# Patient Record
Sex: Male | Born: 2000 | Race: Black or African American | Hispanic: No | Marital: Single | Smoking: Never smoker
Health system: Southern US, Community
[De-identification: ages and names within clinical notes are randomized; demographics above are authoritative.]

## PROBLEM LIST (undated history)

## (undated) DIAGNOSIS — J45909 Unspecified asthma, uncomplicated: Secondary | ICD-10-CM

## (undated) HISTORY — DX: Unspecified asthma, uncomplicated: J45.909

---
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
## 2019-03-15 ENCOUNTER — Other Ambulatory Visit: Payer: Self-pay | Admitting: *Deleted

## 2019-03-15 DIAGNOSIS — Z20822 Contact with and (suspected) exposure to covid-19: Secondary | ICD-10-CM

## 2019-03-19 ENCOUNTER — Other Ambulatory Visit: Payer: Self-pay

## 2019-03-19 DIAGNOSIS — Z20822 Contact with and (suspected) exposure to covid-19: Secondary | ICD-10-CM

## 2019-03-24 LAB — NOVEL CORONAVIRUS, NAA: SARS-CoV-2, NAA: NOT DETECTED

## 2019-04-23 ENCOUNTER — Other Ambulatory Visit: Payer: Self-pay

## 2019-04-23 DIAGNOSIS — Z20822 Contact with and (suspected) exposure to covid-19: Secondary | ICD-10-CM

## 2019-04-23 NOTE — Progress Notes (Unsigned)
l °

## 2019-04-24 LAB — NOVEL CORONAVIRUS, NAA: SARS-CoV-2, NAA: NOT DETECTED

## 2019-06-04 ENCOUNTER — Other Ambulatory Visit: Payer: Self-pay

## 2019-06-04 DIAGNOSIS — Z20822 Contact with and (suspected) exposure to covid-19: Secondary | ICD-10-CM

## 2019-06-05 ENCOUNTER — Telehealth: Payer: Medicaid Other | Admitting: Family Medicine

## 2019-06-06 LAB — NOVEL CORONAVIRUS, NAA: SARS-CoV-2, NAA: DETECTED — AB

## 2019-06-12 ENCOUNTER — Other Ambulatory Visit: Payer: Self-pay | Admitting: Family Medicine

## 2019-06-12 DIAGNOSIS — U071 COVID-19: Secondary | ICD-10-CM

## 2019-06-12 DIAGNOSIS — Z8619 Personal history of other infectious and parasitic diseases: Secondary | ICD-10-CM

## 2019-06-12 DIAGNOSIS — Z8616 Personal history of COVID-19: Secondary | ICD-10-CM

## 2019-06-12 DIAGNOSIS — I400 Infective myocarditis: Secondary | ICD-10-CM

## 2019-06-19 ENCOUNTER — Ambulatory Visit (INDEPENDENT_AMBULATORY_CARE_PROVIDER_SITE_OTHER): Payer: Medicaid Other

## 2019-06-19 ENCOUNTER — Other Ambulatory Visit: Payer: Self-pay

## 2019-06-19 ENCOUNTER — Ambulatory Visit
Admission: RE | Admit: 2019-06-19 | Discharge: 2019-06-19 | Disposition: A | Discharge status: Home / Self Care | Payer: Medicaid Other | Source: Ambulatory Visit | Attending: Family Medicine | Admitting: Family Medicine

## 2019-06-19 ENCOUNTER — Other Ambulatory Visit: Payer: Self-pay | Admitting: Family Medicine

## 2019-06-19 DIAGNOSIS — M545 Low back pain, unspecified: Secondary | ICD-10-CM

## 2019-06-19 DIAGNOSIS — Z8616 Personal history of COVID-19: Secondary | ICD-10-CM

## 2019-06-19 DIAGNOSIS — M79605 Pain in left leg: Secondary | ICD-10-CM

## 2019-06-19 DIAGNOSIS — I400 Infective myocarditis: Secondary | ICD-10-CM

## 2019-06-19 DIAGNOSIS — Z8619 Personal history of other infectious and parasitic diseases: Secondary | ICD-10-CM | POA: Diagnosis not present

## 2019-06-19 DIAGNOSIS — U071 COVID-19: Secondary | ICD-10-CM | POA: Diagnosis not present

## 2019-06-19 IMAGING — CR DG LUMBAR SPINE COMPLETE 4+V
1 series · 6 of 6 positions shown · non-contrast
Comparison: None.

CLINICAL DATA: Low back pain for few weeks.  Lifting injury.

EXAM:
LUMBAR SPINE - COMPLETE 4+ VIEW

[Series 1: dg lumbar spine complete 4 +v · 0.14mm/px · 6 of 6 slices shown]
[im 1/6]
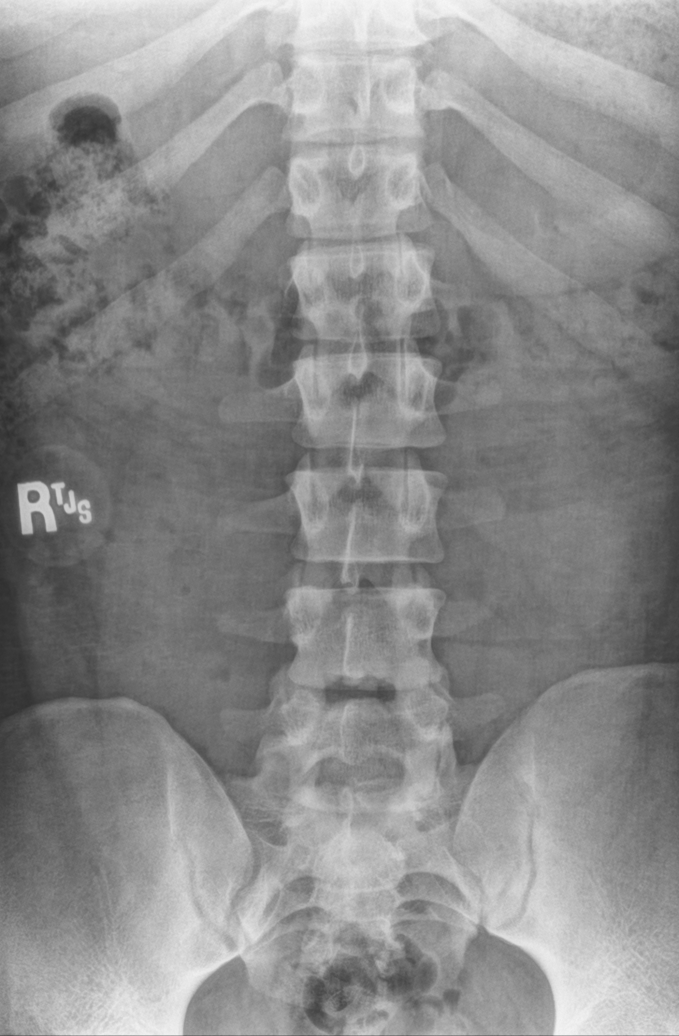
[im 2/6]
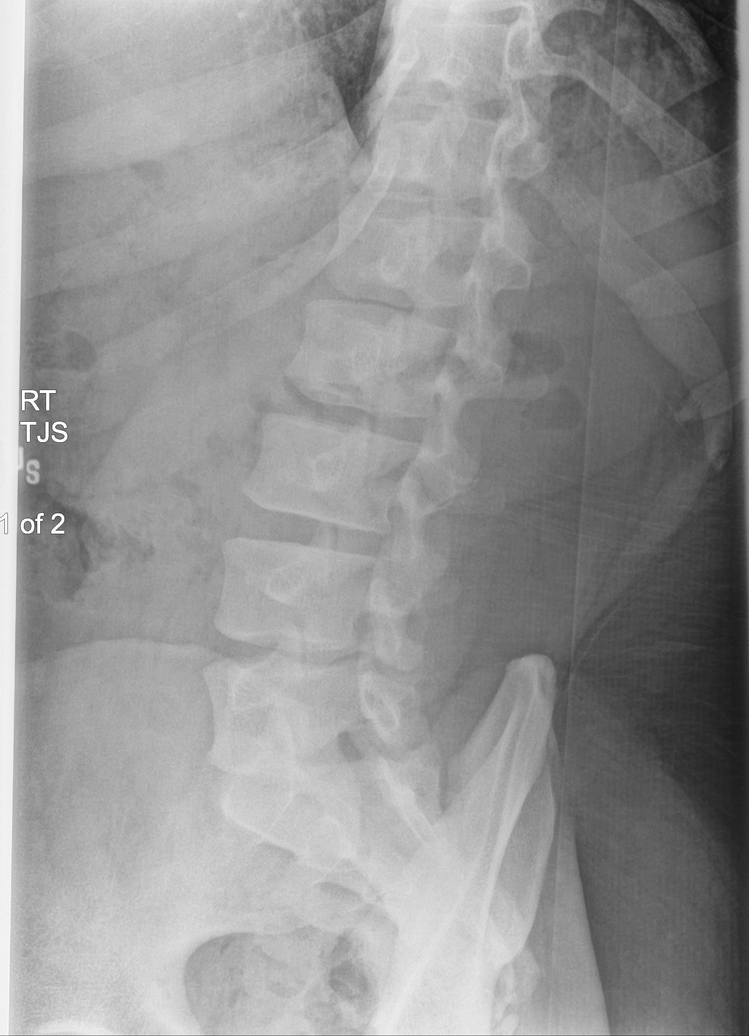
[im 3/6]
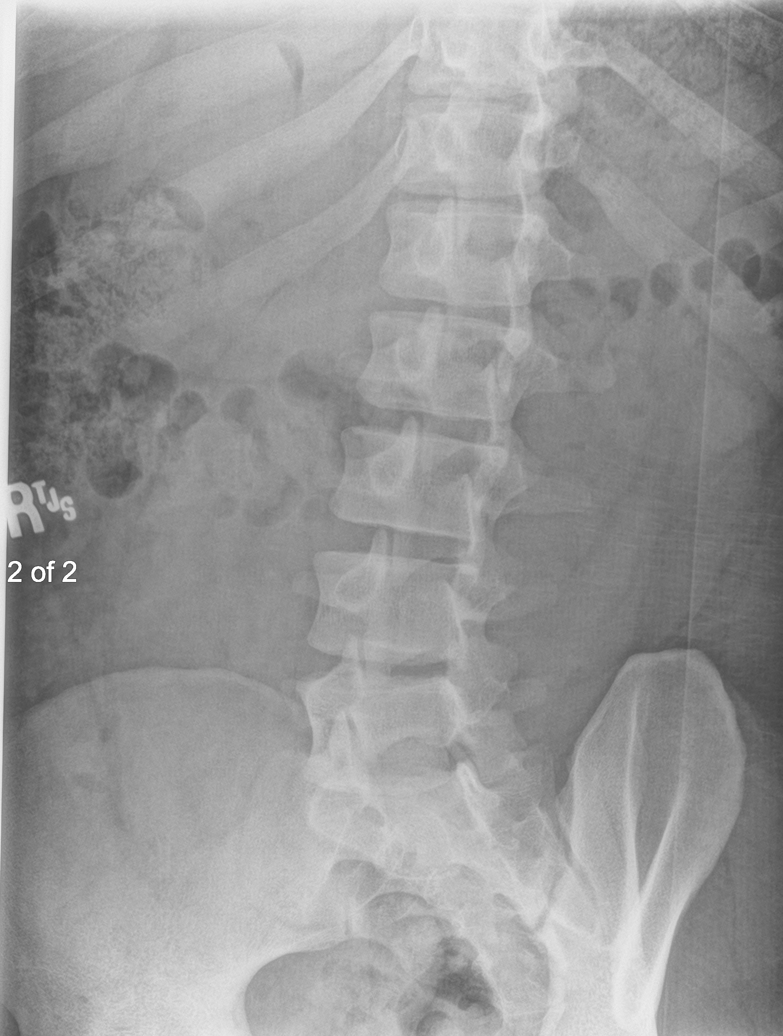
[im 4/6]
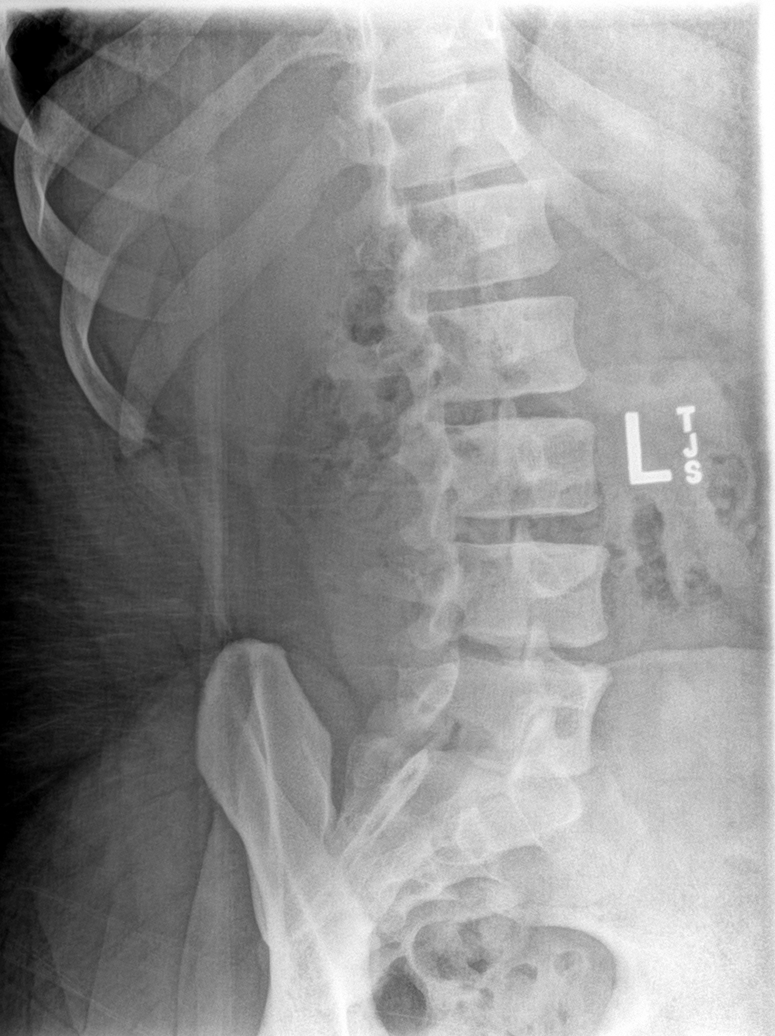
[im 5/6]
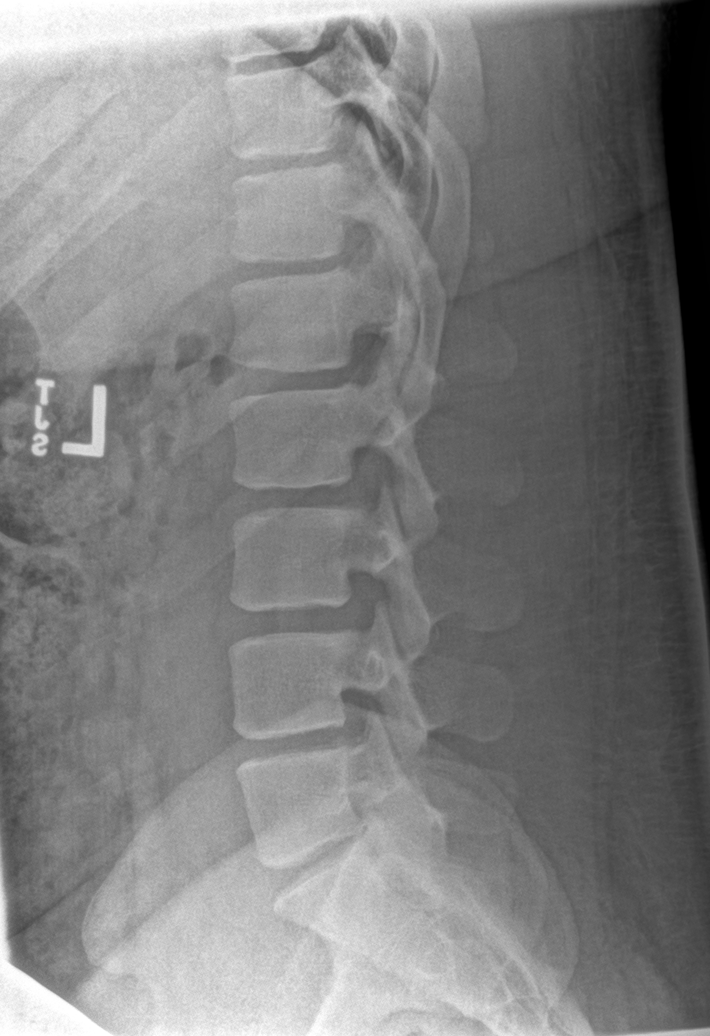
[im 6/6]
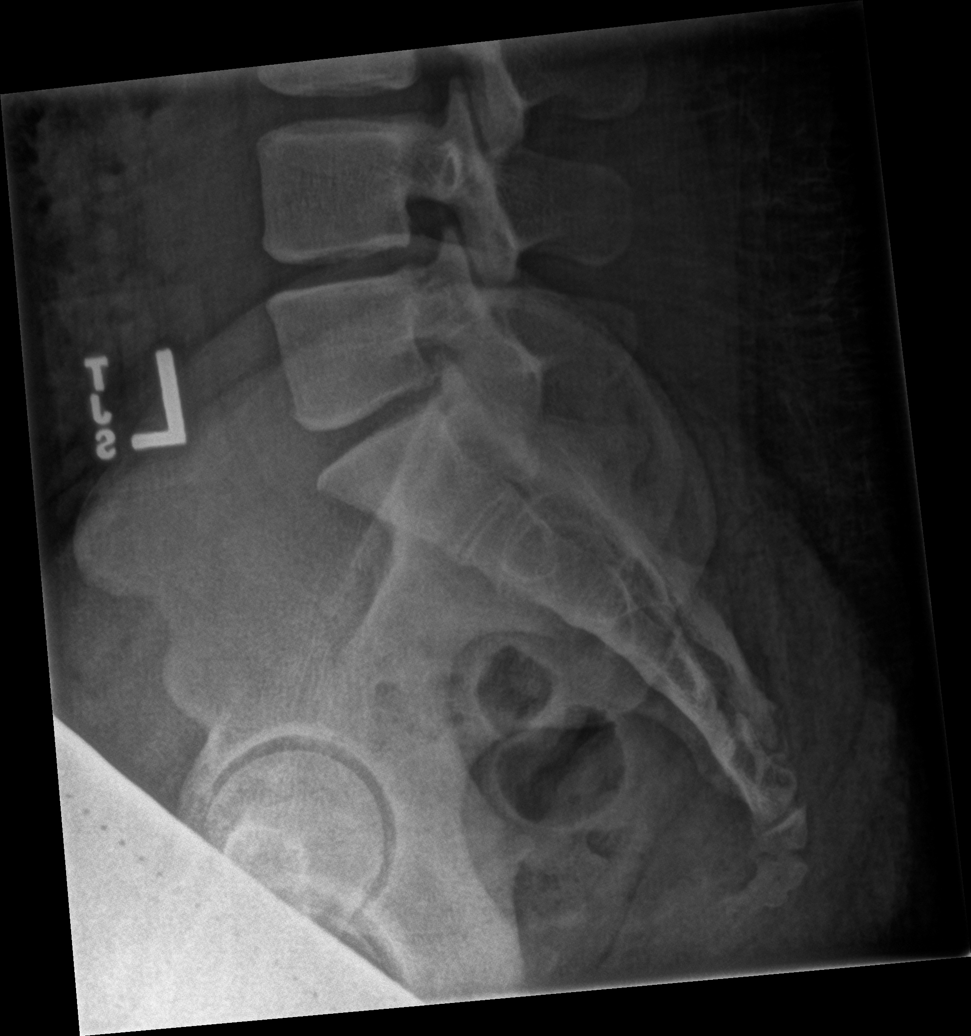

[6 of 6 positions shown; findings below may reference images not displayed]

FINDINGS: Five lumbar type vertebral bodies. Sacroiliac joints are symmetric.
Mild loss of intervertebral disc height at the lumbosacral junction.
Straightening of expected lumbar lordosis could be positional or due
to muscular spasm.
IMPRESSION: No acute osseous abnormality.

## 2019-06-20 ENCOUNTER — Telehealth: Payer: Self-pay | Admitting: Family Medicine

## 2019-06-20 ENCOUNTER — Other Ambulatory Visit: Payer: Self-pay | Admitting: Family Medicine

## 2019-06-20 DIAGNOSIS — Z8616 Personal history of COVID-19: Secondary | ICD-10-CM

## 2019-06-20 DIAGNOSIS — Z8619 Personal history of other infectious and parasitic diseases: Secondary | ICD-10-CM

## 2019-06-20 DIAGNOSIS — R931 Abnormal findings on diagnostic imaging of heart and coronary circulation: Secondary | ICD-10-CM

## 2019-06-20 NOTE — Telephone Encounter (Signed)
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 Saunders Revel, the cardiologist who read his echocardiogram. Dr. Saunders Revel suggests patient be seen by Cardiology this week for further discussion/evaluation. Patient has hx of COVID-19 but currently denies any symptoms. 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 Patient did not get scheduled for troponin and ECG at [facility] prior to having Echo. Will likely have ECG done when evaluated by Cardiology tomorrow.  Patient addresses understanding of plan for referral and is agreeable.

## 2019-06-21 ENCOUNTER — Ambulatory Visit (INDEPENDENT_AMBULATORY_CARE_PROVIDER_SITE_OTHER): Payer: Medicaid Other | Admitting: Cardiology

## 2019-06-21 ENCOUNTER — Telehealth: Payer: Self-pay

## 2019-06-21 ENCOUNTER — Other Ambulatory Visit: Payer: Self-pay

## 2019-06-21 ENCOUNTER — Encounter: Payer: Self-pay | Admitting: Cardiology

## 2019-06-21 ENCOUNTER — Other Ambulatory Visit
Admission: RE | Admit: 2019-06-21 | Discharge: 2019-06-21 | Disposition: A | Discharge status: Home / Self Care | Payer: Medicaid Other | Attending: Cardiology | Admitting: Cardiology

## 2019-06-21 VITALS — BP 146/80 | HR 84 | Ht 74.0 in | Wt 307.0 lb

## 2019-06-21 DIAGNOSIS — Z8619 Personal history of other infectious and parasitic diseases: Secondary | ICD-10-CM

## 2019-06-21 DIAGNOSIS — I517 Cardiomegaly: Secondary | ICD-10-CM | POA: Diagnosis not present

## 2019-06-21 DIAGNOSIS — Z8616 Personal history of COVID-19: Secondary | ICD-10-CM

## 2019-06-21 LAB — BASIC METABOLIC PANEL
Anion gap: 9 (ref 5–15)
BUN: 18 mg/dL (ref 6–20)
CO2: 25 mmol/L (ref 22–32)
Calcium: 9.7 mg/dL (ref 8.9–10.3)
Chloride: 105 mmol/L (ref 98–111)
Creatinine, Ser: 1.2 mg/dL (ref 0.61–1.24)
GFR calc Af Amer: >60 mL/min (ref >60)
GFR calc non Af Amer: >60 mL/min (ref >60)
Glucose, Bld: 91 mg/dL (ref 70–99)
Potassium: 4.4 mmol/L (ref 3.5–5.1)
Sodium: 139 mmol/L (ref 135–145)

## 2019-06-21 LAB — TROPONIN I (HIGH SENSITIVITY): Troponin I (High Sensitivity): 52 ng/L — ABNORMAL HIGH (ref <18)

## 2019-06-21 NOTE — Patient Instructions (Addendum)
Medication Instructions:  Your physician recommends that you continue on your current medications as directed. Please refer to the Current Medication list given to you today.  If you need a refill on your cardiac medications before your next appointment, please call your pharmacy.   Lab work: Your physician recommends that you return for lab work in: TODAY at the [facility], Troponin. - Please go to the [facility]. You will check in at the front desk to the right as you walk into the atrium. Valet Parking is offered if needed.  If you have labs (blood work) drawn today and your tests are completely normal, you will receive your results only by: Marland Kitchen MyChart Message (if you have MyChart) OR . A paper copy in the mail If you have any lab test that is abnormal or we need to change your treatment, we will call you to review the results.  Testing/Procedures: Your physician has requested that you have a cardiac MRI. Cardiac MRI uses a computer to create images of your heart as its beating, producing both still and moving pictures of your heart and major blood vessels. For further information please visit [email or web address] Please follow the instruction sheet given to you today for more information.  You are scheduled for Cardiac MRI on _______________.  Please arrive at the [facility] main entrance of [facility] at _____________(30-45 minutes prior to test start time). ?  [facility]  [address]  [place], [place] [address]  [phone]  Proceed to the [facility] Radiology Department (First Floor).  ?  Magnetic resonance imaging (MRI) is a painless test that produces images of the inside of the body without using X-rays. During an MRI, strong magnets and radio waves work together in a Research officer, political party to form detailed images. MRI images may provide more details about a medical condition than X-rays, CT scans, and ultrasounds can provide.  You may be given  earphones to listen for instructions.  You may eat a light breakfast and take medications as ordered. If a contrast material will be used, an IV will be inserted into one of your veins. Contrast material will be injected into your IV.  You will be asked to remove all metal, including: Watch, jewelry, and other metal objects including hearing aids, hair pieces and dentures. (Braces and fillings normally are not a problem.)  If contrast material was used:  It will leave your body through your urine within a day. You may be told to drink plenty of fluids to help flush the contrast material out of your system.  TEST WILL TAKE APPROXIMATELY 1 HOUR  PLEASE NOTIFY SCHEDULING AT LEAST 24 HOURS IN ADVANCE IF YOU ARE UNABLE TO KEEP YOUR APPOINTMENT. [phone]     Follow-Up: At [facility], you and your health needs are our priority.  As part of our continuing mission to provide you with exceptional heart care, we have created designated Provider Care Teams.  These Care Teams include your primary Cardiologist (physician) and Advanced Practice Providers (APPs -  Physician Assistants and Nurse Practitioners) who all work together to provide you with the care you need, when you need it. You will need a follow up appointment in 4-6 weeks after you have had the Cardiac MRI.   You may see Kate Sable, MD or one of the following Advanced Practice Providers on your designated Care Team:   Murray Hodgkins, NP Christell Faith, PA-C . Marrianne Mood, PA-C  Magnetic Resonance Imaging Magnetic resonance imaging (MRI) is a painless test that takes pictures of the inside of your body. This test uses a strong magnet. This test does not use X-rays. What happens before the procedure?  You will be asked about any metal you may have in your body. This includes: ? Any joint replacement (prosthesis), such as an artificial knee or hip. ? Any implanted devices or ports. ? A metallic ear implant (cochlear  implant). ? An artificial heart valve. ? A metallic object in the eye. ? Metal splinters. ? Bullet fragments. ? Any tattoos. Some of the darker inks can cause problems with testing.  You will be asked to take off all metal. This includes: ? Your watch, jewelry, and other metal objects. ? Hearing aids. ? Dentures. ? Underwire bra. ? Makeup. Some kinds of makeup contain small amounts of metal. ? Braces and fillings normally are not a problem.  If you are breastfeeding, ask your doctor if you need to pump before your test and then stop breastfeeding for a short time. You may need to do this if dye (contrast material) will be used during your MRI.  If you have a fear of cramped spaces (claustrophobia), you may be given medicines prior to the MRI. What happens during the procedure?   You may be given earplugs or headphones to listen to music. The MRI machine can be noisy.  You will lie down on a platform. It looks like a long table.  If dye will be used, an IV tube will be placed into one of your veins. Dye will be given through your IV tube at a certain time as images are taken.  The platform will slide into a tunnel. The tunnel has magnets inside of it. When you are inside the tunnel, you will still be able to talk to your doctor.  The tunnel will scan your body and make images. You will be asked to lie very still. Your doctor will tell you when you can move. You may have to wait a few minutes to make sure that the images from the test are clear.  When all images are taken, the platform will slide out of the tunnel. The procedure may vary among doctors and hospitals. What happens after the procedure?  You may be taken to a recovery area if sedation medicines were used. Your blood pressure, heart rate, breathing rate, and blood oxygen level will be monitored until you leave the hospital or clinic.  If dye was used: ? It will leave your body through your pee (urine). It takes about a  day for all of the dye to leave the body. ? You may be told to drink plenty of fluids. This helps your body get rid of the dye. ? If you are breastfeeding, do not breastfeed your child until your doctor says that this is safe.  You may go back to your normal activities right away, or as told by your doctor.  It is up to you to get your test results. Ask your doctor, or the department that is doing the test, when your results will be ready. Summary  Magnetic resonance imaging (MRI) is a test that takes pictures of the inside of your body.  Before your MRI, be sure to tell your doctor about any metal you may have in your body.  You may go back to your normal activities right away, or as told by your doctor. This information is not intended to replace  advice given to you by your health care provider. Make sure you discuss any questions you have with your health care provider. Document Released: 10/02/2010 Document Revised: 07/25/2017 Document Reviewed: 07/25/2017 Elsevier Patient Education  2020 [employer]

## 2019-06-21 NOTE — Telephone Encounter (Signed)
Patient made aware of test results with verbalized understanding.

## 2019-06-21 NOTE — Telephone Encounter (Signed)
-----   Message from Kate Sable, MD sent at 06/21/2019  1:06 PM EDT ----- Slightly abnormal test. Patient should get cardiac MRI as soon as scheduled.

## 2019-06-21 NOTE — Progress Notes (Signed)
Cardiology Office Note:    Date:  06/21/2019   ID:  Gary Johnson, DOB [birth year], MRN [id number]  PCP:  Jolene Provost, MD  Cardiologist:  Debbe Odea, MD  Electrophysiologist:  None   Referring MD: Jolene Provost, MD   Chief Complaint  Patient presents with  . New Patient (Initial Visit)    referred by PCP for Abnormal ECHO. Meds reviewed verbally with patient.     History of Present Illness:    Gary Johnson is a 18 y.o. male with a hx of asthma, coronavirus/COVID-19 infection date 06/04/2019 presents for follow-up due to abnormal echocardiogram.  Patient states having symptoms of muscle aches, nasal congestion but no fevers about 2 to 3 weeks ago.  He was tested for COVID and test came back positive.  He subsequently had an echocardiogram which revealed ejection fraction of 50 to 55%, mildly increased left ventricular size with moderately increased left ventricular hypertrophy.  The LA and RA size were mildly dilated.  Patient denies any family history of heart disease.  He denies palpitations, syncope.  Past Medical History:  Diagnosis Date  . Asthma     History reviewed. No pertinent surgical history.  Current Medications: No outpatient medications have been marked as taking for the 06/21/19 encounter (Office Visit) with Debbe Odea, MD.     Allergies:   Patient has no known allergies.   Social History   Socioeconomic History  . Marital status: Single    Spouse name: Not on file  . Number of children: Not on file  . Years of education: Not on file  . Highest education level: Not on file  Occupational History  . Not on file  Social Needs  . Financial resource strain: Not on file  . Food insecurity    Worry: Not on file    Inability: Not on file  . Transportation needs    Medical: Not on file    Non-medical: Not on file  Tobacco Use  . Smoking status: Never Smoker  . Smokeless tobacco: Never Used  Substance and Sexual Activity  . Alcohol use:  Never    Frequency: Never  . Drug use: Not on file  . Sexual activity: Not on file  Lifestyle  . Physical activity    Days per week: Not on file    Minutes per session: Not on file  . Stress: Not on file  Relationships  . Social Musician on phone: Not on file    Gets together: Not on file    Attends religious service: Not on file    Active member of club or organization: Not on file    Attends meetings of clubs or organizations: Not on file    Relationship status: Not on file  Other Topics Concern  . Not on file  Social History Narrative  . Not on file     Family History: The patient's family history includes Hypertension in his maternal grandmother and mother.  ROS:   Please see the history of present illness.     All other systems reviewed and are negative.  EKGs/Labs/Other Studies Reviewed:    The following studies were reviewed today:  Echocardiogram dated 06/19/2019 IMPRESSIONS   1. Left ventricular ejection fraction, by visual estimation, is 50 to 55%. The left ventricle has normal function. Mildly increased left ventricular size. There is moderately increased left ventricular hypertrophy.  2. Global right ventricle has normal systolic function.The right ventricular size is normal. No increase in right  ventricular wall thickness.  3. Left atrial size was mildly dilated.  4. Right atrial size was mildly dilated.  5. The mitral valve is normal in structure. No evidence of mitral valve regurgitation.  6. The tricuspid valve is grossly normal. Tricuspid valve regurgitation is trivial.  7. The aortic valve was not well visualized Aortic valve regurgitation is mild by color flow Doppler. There is no aortic valve stenosis.  8. The pulmonic valve was not well visualized. Pulmonic valve regurgitation is trivial by color flow Doppler.  9. Normal pulmonary artery systolic pressure. 10. The inferior vena cava is normal in size with greater than 50% respiratory  variability, suggesting right atrial pressure of 3 mmHg. 11. The interatrial septum was not well visualized.  EKG:  EKG is  ordered today.  The ekg ordered today demonstrates normal sinus rhythm, LVH by voltage criteria.  Recent Labs: 09/26/2018: Hemoglobin 14.2; Platelets 206  Recent Lipid Panel No results found for: CHOL, TRIG, HDL, CHOLHDL, VLDL, LDLCALC, LDLDIRECT  Physical Exam:    VS:  BP (!) 146/80 (BP Location: Left Arm, Patient Position: Sitting, Cuff Size: Large)   Pulse 84   Ht 6\' 2"  (1.88 m)   Wt (!) 307 lb (139.3 kg)   BMI 39.42 kg/m     Wt Readings from Last 3 Encounters:  06/21/19 (!) 307 lb (139.3 kg) (>99 %, Z= 3.14)*   * Growth percentiles are based on CDC (Boys, 2-20 Years) data.     GEN:  Well nourished, well developed in no acute distress HEENT: Normal NECK: No JVD; No carotid bruits LYMPHATICS: No lymphadenopathy CARDIAC: RRR, no murmurs, rubs, gallops RESPIRATORY:  Clear to auscultation without rales, wheezing or rhonchi  ABDOMEN: Soft, non-tender, non-distended MUSCULOSKELETAL:  No edema; No deformity  SKIN: Warm and dry NEUROLOGIC:  Alert and oriented x 3 PSYCHIATRIC:  Normal affect   ASSESSMENT:   The patient is currently asymptomatic.  His EKG did reveal LVH.  His echocardiogram did reveal mild right atrial enlargement, mild left atrial enlargement, left ventricular enlargement.  Echocardiogram also revealed moderate LVH in the inferior wall measuring 1.5 to 1.6 cm.  The septal wall has normal thickness  With regards to cardiac involvement in light of coronavirus infection, a lot of the treatment options or guidelines is based on expert opinions.  It is even more difficult to ascertain etiology of cardiac changes in athletes because these patients by enlarge have some echocardiographic changes due to physiologic cardiac remodeling secondary to exercise.  With that said, leaning on one such recent viewpoint from [facility] cardiology by Lennice SitesPhelan et al, Mr.  Mayford KnifeWilliams' troponin level is not known. His EKG showed LVH.  His echocardiogram showing mild RA, LA and LV enlargement is likely secondary to athletic heart or cardiac remodeling secondary to exercise.  I do not think the mild chamber changes are due to coronavirus infection or myocarditis.  We will still obtain high-sensitivity troponin as recommended.  However the patient has posterior wall LVH measuring 1.5 to 1.6 cm on echocardiogram.  The septal wall has normal thickness.  This may represent a variant of HCM.  1. LVH (left ventricular hypertrophy)   2. History of 2019 novel coronavirus disease (COVID-19)    PLAN:    In order of problems listed above:  1. Get cardiac MRI with and without contrast to evaluate hypertrophy and also extent of any scar (LGE) that may suggest both HCM and possible myocarditis. 2. Get high-sensitivity troponin.  Follow-up with me after above tests.  Medication Adjustments/Labs and Tests Ordered: Current medicines are reviewed at length with the patient today.  Concerns regarding medicines are outlined above.  Orders Placed This Encounter  Procedures  . MR Card Morphology Wo/W Cm  . Basic metabolic panel  . EKG 12-Lead   No orders of the defined types were placed in this encounter.   Patient Instructions  Medication Instructions:  Your physician recommends that you continue on your current medications as directed. Please refer to the Current Medication list given to you today.  If you need a refill on your cardiac medications before your next appointment, please call your pharmacy.   Lab work: Your physician recommends that you return for lab work in: TODAY at the [facility] - [facility], Troponin. - Please go to the [facility]. You will check in at the front desk to the right as you walk into the atrium. Valet Parking is offered if needed.  If you have labs (blood work) drawn today and your tests are completely normal, you will receive your  results only by: Marland Kitchen MyChart Message (if you have MyChart) OR . A paper copy in the mail If you have any lab test that is abnormal or we need to change your treatment, we will call you to review the results.  Testing/Procedures: Your physician has requested that you have a cardiac MRI. Cardiac MRI uses a computer to create images of your heart as its beating, producing both still and moving pictures of your heart and major blood vessels. For further information please visit [email or web address]. Please follow the instruction sheet given to you today for more information.  You are scheduled for Cardiac MRI on _______________.  Please arrive at the [facility] main entrance of [facility] at _____________(30-45 minutes prior to test start time). ?  [facility]  [address]  [place], [place] [address]  [phone]  Proceed to the [facility] Radiology Department (First Floor).  ?  Magnetic resonance imaging (MRI) is a painless test that produces images of the inside of the body without using X-rays. During an MRI, strong magnets and radio waves work together in a Data processing manager to form detailed images. MRI images may provide more details about a medical condition than X-rays, CT scans, and ultrasounds can provide.  You may be given earphones to listen for instructions.  You may eat a light breakfast and take medications as ordered. If a contrast material will be used, an IV will be inserted into one of your veins. Contrast material will be injected into your IV.  You will be asked to remove all metal, including: Watch, jewelry, and other metal objects including hearing aids, hair pieces and dentures. (Braces and fillings normally are not a problem.)  If contrast material was used:  It will leave your body through your urine within a day. You may be told to drink plenty of fluids to help flush the contrast material out of your system.  TEST WILL TAKE APPROXIMATELY 1 HOUR   PLEASE NOTIFY SCHEDULING AT LEAST 24 HOURS IN ADVANCE IF YOU ARE UNABLE TO KEEP YOUR APPOINTMENT. [phone]     Follow-Up: At [facility], you and your health needs are our priority.  As part of our continuing mission to provide you with exceptional heart care, we have created designated Provider Care Teams.  These Care Teams include your primary Cardiologist (physician) and Advanced Practice Providers (APPs -  Physician Assistants and Nurse Practitioners) who all work together to  provide you with the care you need, when you need it. You will need a follow up appointment in 4-6 weeks after you have had the Cardiac MRI.   You may see Kate Sable, MD or one of the following Advanced Practice Providers on your designated Care Team:   Murray Hodgkins, NP Christell Faith, PA-C . Marrianne Mood, PA-C    Magnetic Resonance Imaging Magnetic resonance imaging (MRI) is a painless test that takes pictures of the inside of your body. This test uses a strong magnet. This test does not use X-rays. What happens before the procedure?  You will be asked about any metal you may have in your body. This includes: ? Any joint replacement (prosthesis), such as an artificial knee or hip. ? Any implanted devices or ports. ? A metallic ear implant (cochlear implant). ? An artificial heart valve. ? A metallic object in the eye. ? Metal splinters. ? Bullet fragments. ? Any tattoos. Some of the darker inks can cause problems with testing.  You will be asked to take off all metal. This includes: ? Your watch, jewelry, and other metal objects. ? Hearing aids. ? Dentures. ? Underwire bra. ? Makeup. Some kinds of makeup contain small amounts of metal. ? Braces and fillings normally are not a problem.  If you are breastfeeding, ask your doctor if you need to pump before your test and then stop breastfeeding for a short time. You may need to do this if dye (contrast material) will be used during your  MRI.  If you have a fear of cramped spaces (claustrophobia), you may be given medicines prior to the MRI. What happens during the procedure?   You may be given earplugs or headphones to listen to music. The MRI machine can be noisy.  You will lie down on a platform. It looks like a long table.  If dye will be used, an IV tube will be placed into one of your veins. Dye will be given through your IV tube at a certain time as images are taken.  The platform will slide into a tunnel. The tunnel has magnets inside of it. When you are inside the tunnel, you will still be able to talk to your doctor.  The tunnel will scan your body and make images. You will be asked to lie very still. Your doctor will tell you when you can move. You may have to wait a few minutes to make sure that the images from the test are clear.  When all images are taken, the platform will slide out of the tunnel. The procedure may vary among doctors and hospitals. What happens after the procedure?  You may be taken to a recovery area if sedation medicines were used. Your blood pressure, heart rate, breathing rate, and blood oxygen level will be monitored until you leave the hospital or clinic.  If dye was used: ? It will leave your body through your pee (urine). It takes about a day for all of the dye to leave the body. ? You may be told to drink plenty of fluids. This helps your body get rid of the dye. ? If you are breastfeeding, do not breastfeed your child until your doctor says that this is safe.  You may go back to your normal activities right away, or as told by your doctor.  It is up to you to get your test results. Ask your doctor, or the department that is doing the test, when your results will  be ready. Summary  Magnetic resonance imaging (MRI) is a test that takes pictures of the inside of your body.  Before your MRI, be sure to tell your doctor about any metal you may have in your body.  You may go  back to your normal activities right away, or as told by your doctor. This information is not intended to replace advice given to you by your health care provider. Make sure you discuss any questions you have with your health care provider. Document Released: 10/02/2010 Document Revised: 07/25/2017 Document Reviewed: 07/25/2017 Elsevier Patient Education  2020 [employer].      Signed, Debbe Odea, MD  06/21/2019 11:48 AM    [facility]

## 2019-06-22 ENCOUNTER — Telehealth: Payer: Self-pay | Admitting: *Deleted

## 2019-06-22 ENCOUNTER — Encounter: Payer: Self-pay | Admitting: *Deleted

## 2019-06-22 ENCOUNTER — Other Ambulatory Visit: Payer: Medicaid Other | Admitting: Family Medicine

## 2019-06-22 NOTE — Telephone Encounter (Signed)
Called to inform patient of appointment for Cardiac MRI scheduled for 07/06/19 at 8:00 am at [facility].  Arrival time is 7:15 am at the 1st floor admissions office.  Will mail letter to patient and keep trying to reach him by phone.

## 2019-06-27 ENCOUNTER — Ambulatory Visit: Payer: Medicaid Other

## 2019-06-28 ENCOUNTER — Telehealth: Payer: Self-pay | Admitting: Family Medicine

## 2019-06-28 DIAGNOSIS — M545 Low back pain, unspecified: Secondary | ICD-10-CM

## 2019-06-28 DIAGNOSIS — M79605 Pain in left leg: Secondary | ICD-10-CM

## 2019-06-28 MED ORDER — NAPROXEN 500 MG PO TABS: 500.0000 mg | ORAL_TABLET | Freq: Two times a day (BID) | ORAL | 0 refills | Status: AC

## 2019-06-28 NOTE — Progress Notes (Signed)
Telemedicine Visit  Patient agrees to telemedicine visit. Patient has had lower back pain with radicular symptoms since early September. The radicular symptoms go to about the left posterior mid thigh at its worst. Overall his symptoms have improved but have not resolved. Feels the symptoms usually with flexion and getting up from a seated position. 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 Denies any incontinence, fever, lower extremity weakness, foot drop, weight loss. Has taken NSAIDs on occasion and is also doing PT. LS Xray suggestive of muscle spasm and mild loss of disc height at LS junction.   ROS: Negative except mentioned above. No Vitals GENERAL: NAD No Further Exam due to televisit  A/P: Lower Back Pain with Radicular Symptoms - Given symptoms and Xray findings recommend MRI LS Spine to evaluate for discogenic etiology. Continue NSIADS, PT, no activity for now given patient still needs cardiac clearance after having COVID-19. Seek medical attention if any symptoms worsen as discussed.

## 2019-07-02 ENCOUNTER — Ambulatory Visit
Admission: RE | Admit: 2019-07-02 | Discharge: 2019-07-02 | Disposition: A | Discharge status: Home / Self Care | Payer: Medicaid Other | Source: Ambulatory Visit | Attending: Family Medicine | Admitting: Family Medicine

## 2019-07-02 ENCOUNTER — Other Ambulatory Visit: Payer: Self-pay

## 2019-07-02 DIAGNOSIS — M79605 Pain in left leg: Secondary | ICD-10-CM | POA: Diagnosis present

## 2019-07-02 DIAGNOSIS — M545 Low back pain, unspecified: Secondary | ICD-10-CM

## 2019-07-02 IMAGING — MR MR LUMBAR SPINE W/O CM
5 series · 31 of 48 positions shown · non-contrast
Comparison: Prior radiograph from [DATE].

CLINICAL DATA: Initial evaluation for persistent central lower back
pain despite conservative management.

EXAM:
MRI LUMBAR SPINE WITHOUT CONTRAST
TECHNIQUE: Multiplanar, multisequence MR imaging of the lumbar spine was
performed. No intravenous contrast was administered.

[Series 5: T2 · sagittal · 4.0mm · 0.88mm/px · 6 of 15 slices shown (1 of 2)]
[im 1/15]
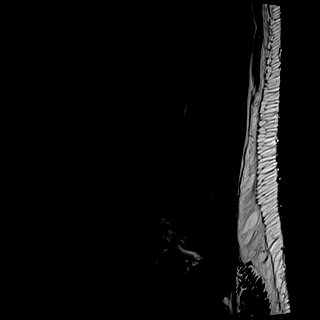
[im 3/15]
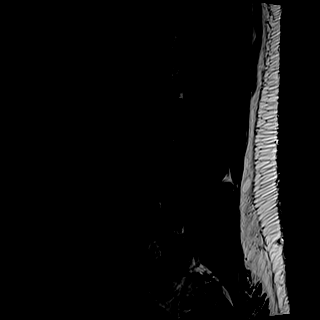
[im 6/15]
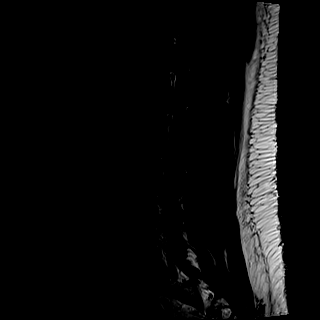
[im 9/15]
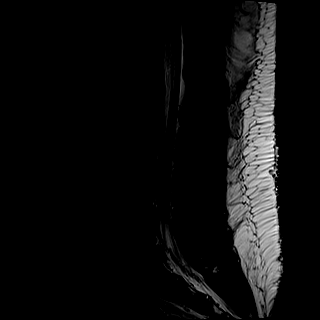
[im 12/15]
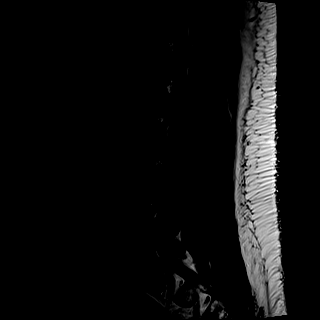
[im 15/15]
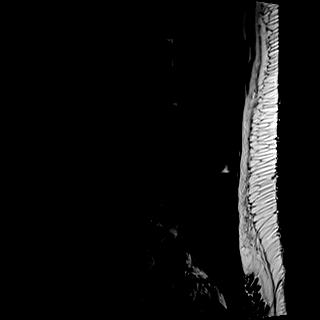

[Series 6: T1 · sagittal · 4.0mm · 0.88mm/px · 6 of 15 slices shown (1 of 2)]
[im 1/15]
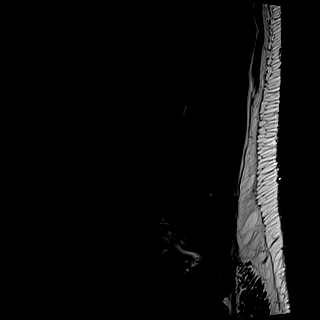
[im 3/15]
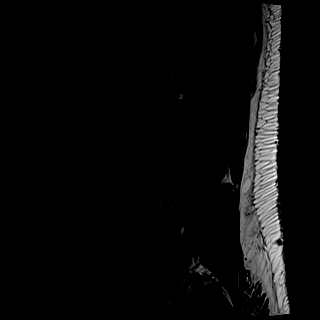
[im 6/15]
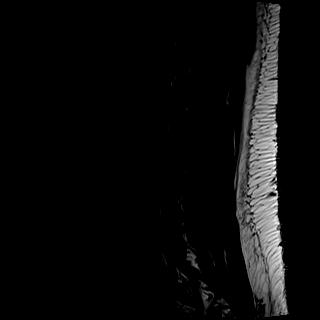
[im 9/15]
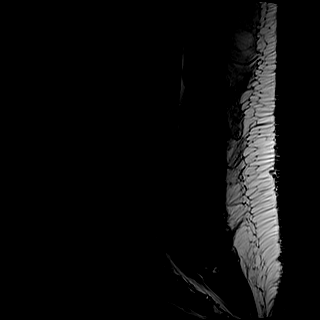
[im 12/15]
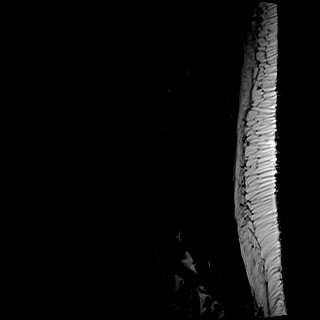
[im 15/15]
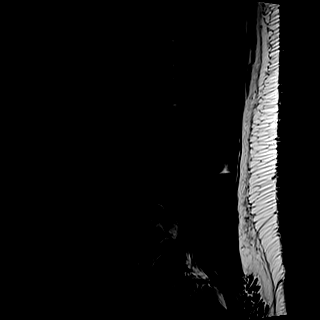

[Series 7: STIR · sagittal · 4.0mm · 0.44mm/px · 1 of 15 slices shown]
[im 1/15]
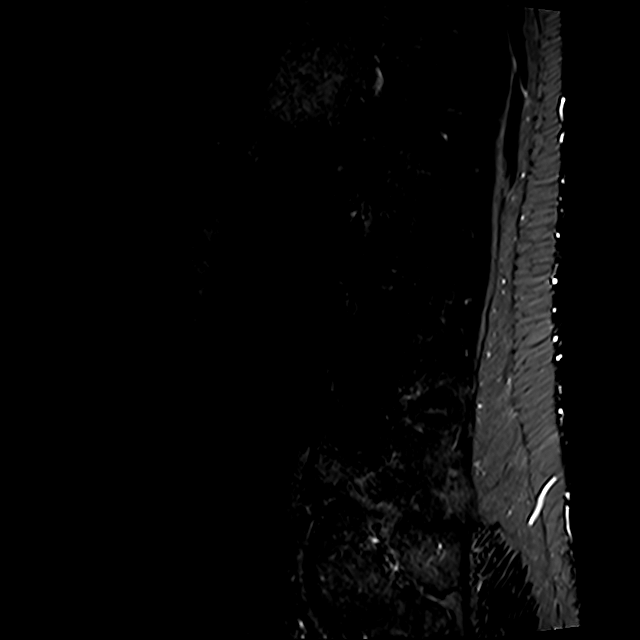

[Series 8: T2 · axial · 4.0mm · 0.78mm/px · z∈[-57,+165]mm · 9 of 38 slices shown (2 of 2)]
[im 1/38]
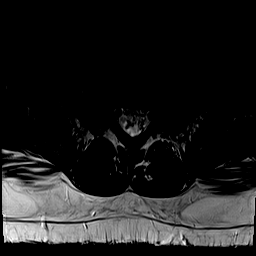
[im 6/38]
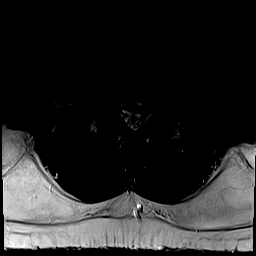
[im 11/38]
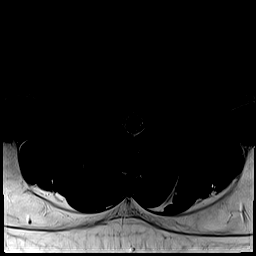
[im 16/38]
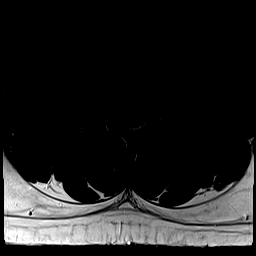
[im 19/38]
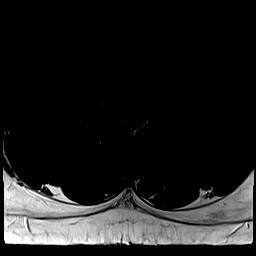
[im 22/38]
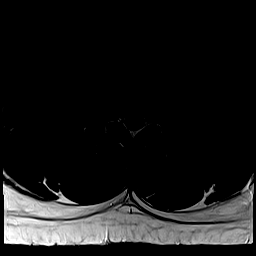
[im 27/38]
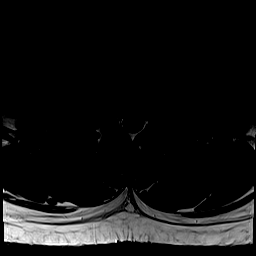
[im 32/38]
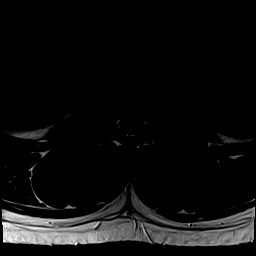
[im 38/38]
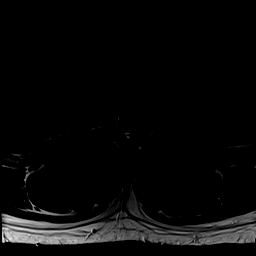

[Series 9: T1 · axial · 4.0mm · 0.39mm/px · z∈[-57,+165]mm · 9 of 38 slices shown (2 of 2)]
[im 1/38]
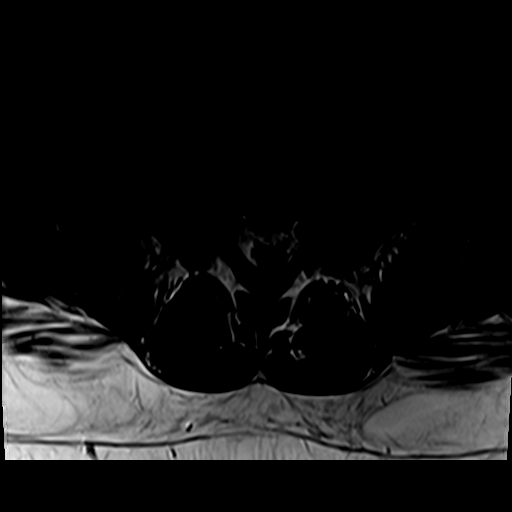
[im 6/38]
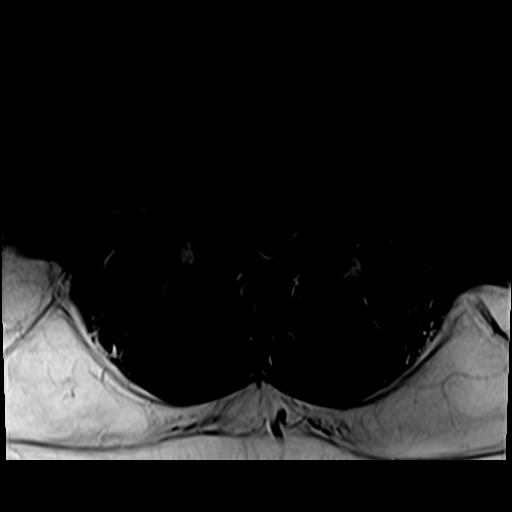
[im 11/38]
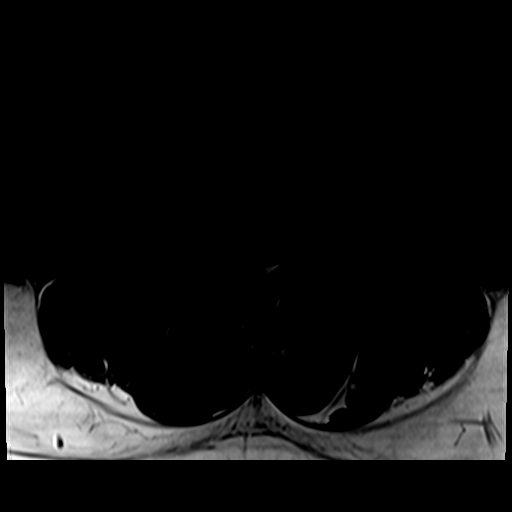
[im 16/38]
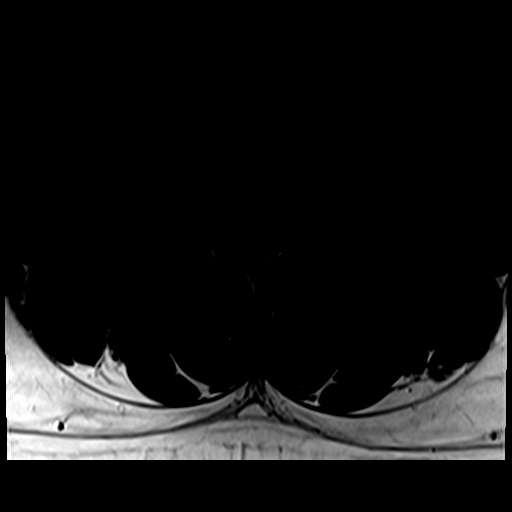
[im 19/38]
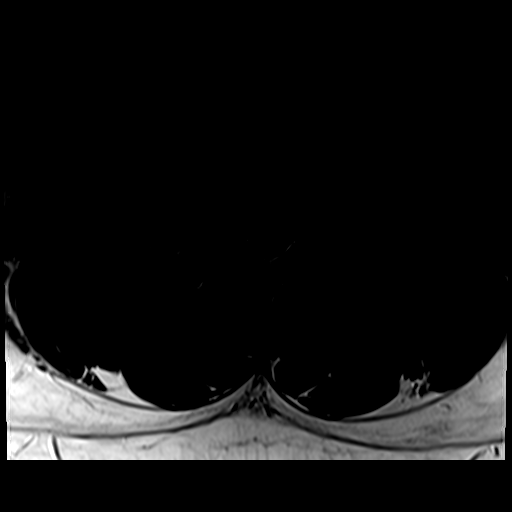
[im 22/38]
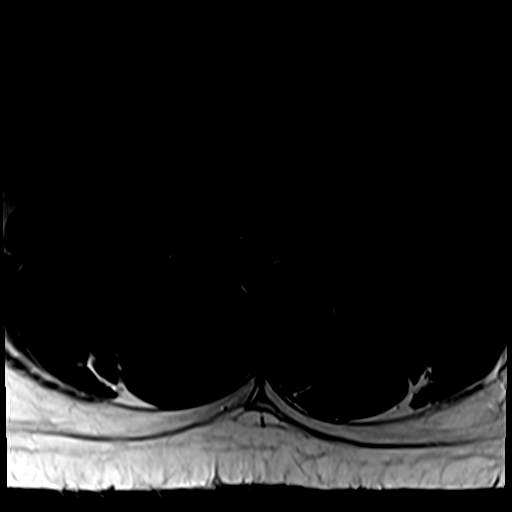
[im 27/38]
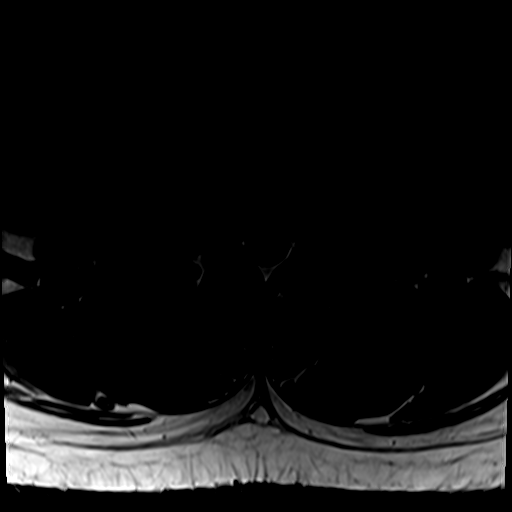
[im 32/38]
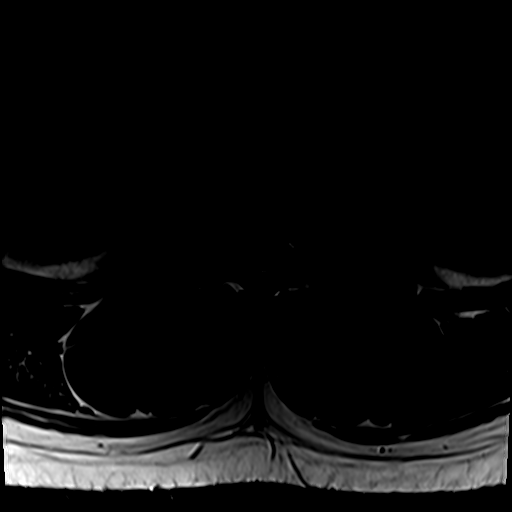
[im 38/38]
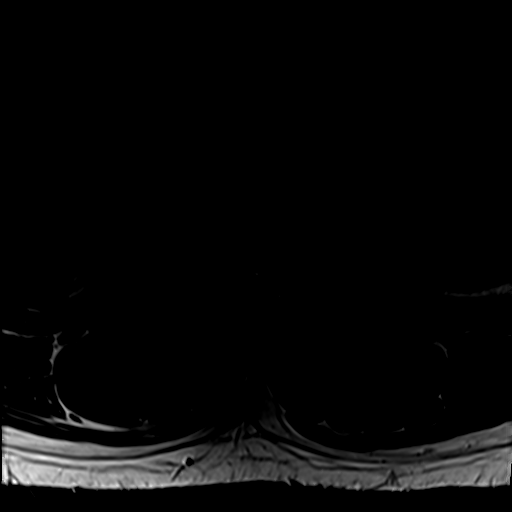

[31 of 48 positions shown; findings below may reference images not displayed]

FINDINGS: Segmentation: Standard. Lowest well-formed disc labeled the L5-S1
level.

Alignment: Mild straightening of the normal lumbar lordosis. No
listhesis.

Vertebrae: Vertebral body height maintained without evidence for
acute or chronic fracture. Bone marrow signal intensity within
normal limits for age. No discrete or worrisome osseous lesions. No
abnormal marrow edema.

Conus medullaris and cauda equina: Conus extends to the T12-L1
level. Conus and cauda equina appear normal.

Paraspinal and other soft tissues: Mild edema seen within the left
posterior paraspinous musculature, suggesting muscular injury/strain
(series 7, image 11). Paraspinous soft tissues demonstrate no other
acute finding. Visualized visceral structures unremarkable.

Disc levels:

No significant findings are seen from the L3-4 level and above.

L4-5: Degenerative disc desiccation with mild intervertebral disc
space narrowing. Superimposed tiny central disc protrusion with
annular fissure. No significant canal or lateral recess stenosis.
Foramina remain patent. No impingement.

L5-S1: Chronic intervertebral disc space narrowing with diffuse disc
bulge and disc desiccation. Superimposed broad-based central disc
protrusion closely approximates the descending S1 nerve roots
without neural impingement or displacement. Associated annular
fissure noted. No significant canal or lateral recess stenosis.
Foramina remain patent.
IMPRESSION: 1. Shallow central disc protrusion at L5-S1, closely approximating
the descending S1 nerve roots without frank neural impingement or
displacement.
2. Tiny central disc protrusion at L4-5 without stenosis or
impingement.
3. Mild edema within the left posterior paraspinous musculature,
suggesting muscular injury/strain.

## 2019-07-05 ENCOUNTER — Telehealth (HOSPITAL_COMMUNITY): Payer: Self-pay | Admitting: Emergency Medicine

## 2019-07-05 NOTE — Telephone Encounter (Signed)
Reaching out to patient to offer assistance regarding upcoming cardiac imaging study; pt verbalizes understanding of appt date/time, parking situation and where to check in,and verified current allergies; name and call back number provided for further questions should they arise Marchia Bond RN [place] [facility] [phone] office [phone] cell  Pt denies implants/metal; pt denies claustrophobia

## 2019-07-06 ENCOUNTER — Ambulatory Visit (HOSPITAL_COMMUNITY)
Admission: RE | Admit: 2019-07-06 | Discharge: 2019-07-06 | Disposition: A | Discharge status: Home / Self Care | Payer: Medicaid Other | Source: Ambulatory Visit | Attending: Cardiology | Admitting: Cardiology

## 2019-07-06 ENCOUNTER — Other Ambulatory Visit: Payer: Self-pay

## 2019-07-06 DIAGNOSIS — I517 Cardiomegaly: Secondary | ICD-10-CM

## 2019-07-06 IMAGING — MR MR CARD MORPHOLOGY WO/W CM
11 of 13 series · 38 of 40 positions shown · IV contrast (Gadavist)
Comparison: none

CLINICAL DATA: ?Hypertrophic cardiomyopathy. Recent IDA MARI
coronavirus infection.

EXAM:
CARDIAC MRI
TECHNIQUE: The patient was scanned on a 1.5 Tesla GE magnet. A dedicated
cardiac coil was used. Functional imaging was done using Fiesta
sequences. [DATE], and 4 chamber views were done to assess for RWMA's.
Modified IDA MARI rule using a short axis stack was used to
calculate an ejection fraction on a dedicated work station using
Circle software. The patient received 8 cc of Gadavist. After 10
minutes inversion recovery sequences were used to assess for
infiltration and scar tissue.

[Series 6: bSSFP · oblique · 8.0mm · 1.70mm/px · 16 of 500 slices shown (1 of 5)]
[im 1/500]
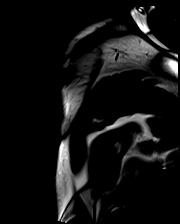
[im 34/500]
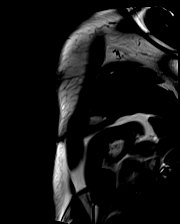
[im 67/500]
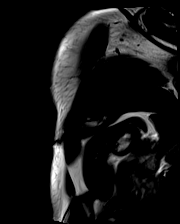
[im 100/500]
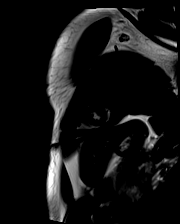
[im 134/500]
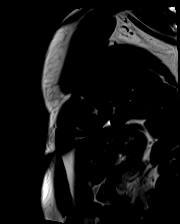
[im 167/500]
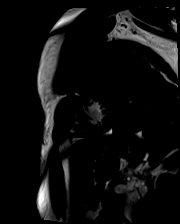
[im 200/500]
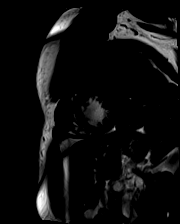
[im 233/500]
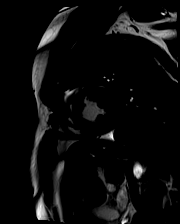
[im 267/500]
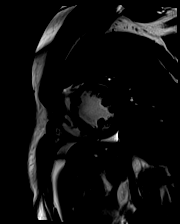
[im 300/500]
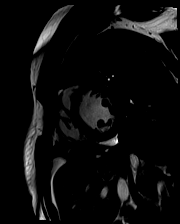
[im 333/500]
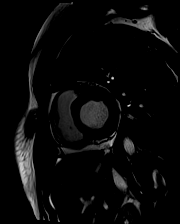
[im 366/500]
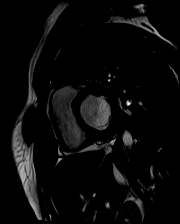
[im 400/500]
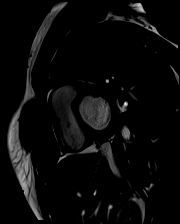
[im 433/500]
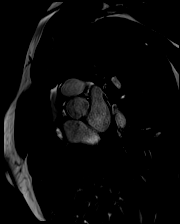
[im 466/500]
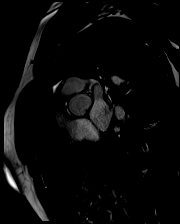
[im 500/500]
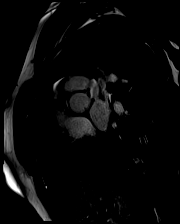

[Series 7: bSSFP · oblique · 6.0mm · 1.70mm/px · 13 of 400 slices shown (2 of 5)]
[im 1/400]
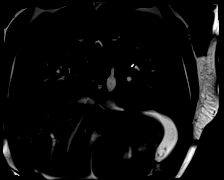
[im 34/400]
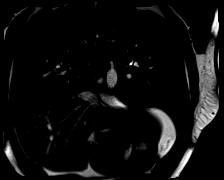
[im 67/400]
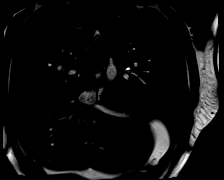
[im 100/400]
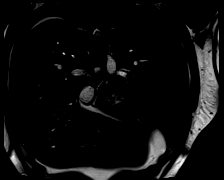
[im 134/400]
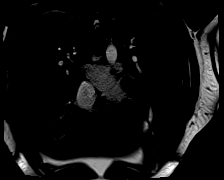
[im 167/400]
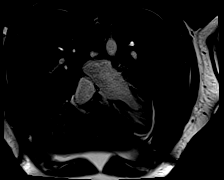
[im 200/400]
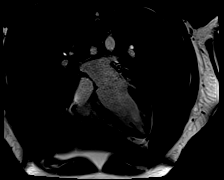
[im 233/400]
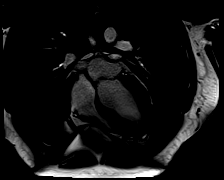
[im 267/400]
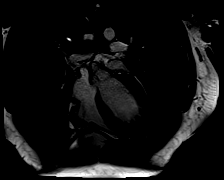
[im 300/400]
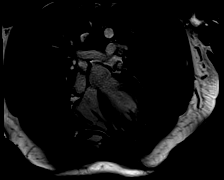
[im 333/400]
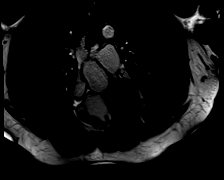
[im 366/400]
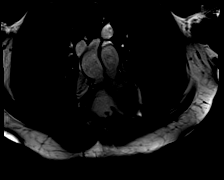
[im 400/400]
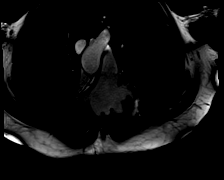

[Series 8: t2_stir_db_sax · oblique · 8.0mm · 1.73mm/px · 1 of 18 slices shown]
[im 1/18]
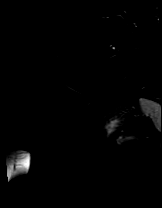

[Series 9: t2_stir_db_radial ((date)ch) · oblique · 6.0mm · 1.73mm/px · 1 of 3 slices shown]
[im 1/3]
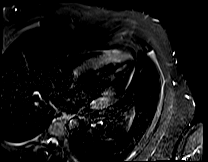

[Series 10: bSSFP · oblique · 6.0mm · 1.41mm/px · 1 of 25 slices shown (3 of 5)]
[im 1/25]
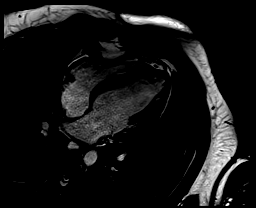

[Series 11: bSSFP · coronal · 6.0mm · 1.41mm/px · 1 of 25 slices shown (4 of 5)]
[im 1/25]
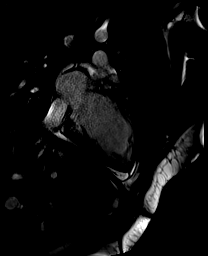

[Series 12: bSSFP · oblique · 6.0mm · 1.41mm/px · 1 of 25 slices shown (5 of 5)]
[im 1/25]
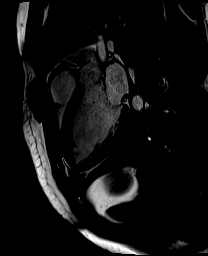

[Series 14: lge_single shot sa · oblique · 8.0mm · 1.98mm/px · 1 of 20 slices shown (1 of 2)]
[im 1/20]
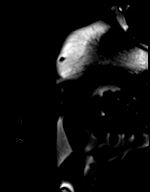

[Series 15: lge_single shot sa · oblique · 8.0mm · 1.98mm/px · 1 of 20 slices shown (2 of 2)]
[im 1/20]
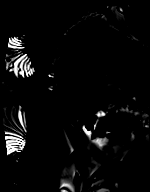

[Series 23: lge short axis_mag · oblique · 8.0mm · 1.61mm/px · 1 of 20 slices shown]
[im 1/20]
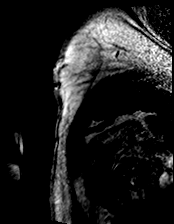

[Series 24: lge short axis_psir · oblique · 8.0mm · 1.61mm/px · 1 of 20 slices shown]
[im 1/20]
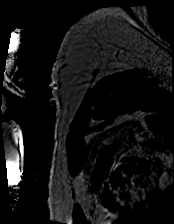

[38 of 40 positions shown; findings below may reference images not displayed]

FINDINGS: Limited images of the lung fields showed no gross abnormalities.

The left ventricle is mildly dilated (LVEDD 6.2 cm), probably not
significant in a trained athlete. I do not think that there is
significant LV hypertrophy present (12 mm anteroseptal and
inferolateral walls, not significant in a trained athlete). Normal
LV systolic function, EF 52%, with normal wall motion. Normal right
ventricular size and systolic function, EF 46%. Normal right and
left atrial sizes. The aortic valve appears bicuspid without
significant regurgitation or stenosis. No significant mitral
regurgitation.

On delayed enhancement imaging, there was no myocardial late
gadolinium enhancement (LGE).

LVEDV 281 mL

LVSV 145 mL

LVEF 52%

RVEDV 235 mL

RVSV 108 mL

RVEF 46%
IMPRESSION: 1. The left ventricle is mildly dilated with EF 52%. I think that
this is normal in a young, trained athlete (EF >45% likely normal).
Significant LV hypertrophy is not present.

2.  Normal RV size and systolic function, EF 46%.

3. The aortic valve is bicuspid but there is no significant
regurgitation or stenosis (the thoracic aorta is incompletely imaged
but does not appear dilated).

4. No myocardial LGE, so no definitive evidence for prior MI,
infiltrative disease, or myocarditis.

I do not see evidence for hypertrophic cardiomyopathy or [L3]
myocarditis.

IDA MARI

## 2019-07-06 MED ORDER — GADOBUTROL 1 MMOL/ML IV SOLN
10.0000 mL | Freq: Once | INTRAVENOUS | Status: AC | PRN
  Administered 2019-07-06: 10 mL via INTRAVENOUS

## 2019-07-10 ENCOUNTER — Telehealth: Payer: Self-pay

## 2019-07-10 NOTE — Telephone Encounter (Signed)
Patient made aware of echo results with verbalized understanding. 

## 2019-07-10 NOTE — Telephone Encounter (Signed)
-----   Message from Kate Sable, MD sent at 07/10/2019  8:32 AM EDT ----- Bicuspid aortic valve, no evidence of HCM or myocarditis. He can return to athletic play with repeat echo in 1-2years to monitor aortic root size.

## 2019-07-26 ENCOUNTER — Other Ambulatory Visit: Payer: Self-pay

## 2019-07-26 ENCOUNTER — Ambulatory Visit (INDEPENDENT_AMBULATORY_CARE_PROVIDER_SITE_OTHER): Payer: Medicaid Other | Admitting: Cardiology

## 2019-07-26 ENCOUNTER — Encounter: Payer: Self-pay | Admitting: Cardiology

## 2019-07-26 VITALS — BP 120/88 | HR 85 | Ht 74.0 in | Wt 312.0 lb

## 2019-07-26 DIAGNOSIS — I517 Cardiomegaly: Secondary | ICD-10-CM | POA: Diagnosis not present

## 2019-07-26 NOTE — Progress Notes (Signed)
Cardiology Office Note:    Date:  07/26/2019   ID:  Gary Johnson, DOB January 10, 2001, MRN [id number]  PCP:  Jolene ProvostPatel, Kirtida, MD  Cardiologist:  Debbe OdeaBrian Agbor-Etang, MD  Electrophysiologist:  None   Referring MD: Jolene ProvostPatel, Kirtida, MD   Chief Complaint  Patient presents with  . other    4-6 week follow up. aptient denies chest pain and SOB at this time. Meds reviewed verbally with patient.     History of Present Illness:    Gary Johnson is a 18 y.o. male with a hx of asthma, coronavirus/COVID-19 infection date 06/04/2019 presents for follow-up.  He was originally seen due to an abnormal  echocardiogram which revealed ejection fraction of 50 to 55%, mildly increased left ventricular size with moderately increased left ventricular hypertrophy.  The LA and RA size were mildly dilated.  Patient denies any family history of heart disease.  He denies palpitations, syncope.  Cardiac MRI was performed to evaluate for HCM and or myocarditis.  Past Medical History:  Diagnosis Date  . Asthma     History reviewed. No pertinent surgical history.  Current Medications: No outpatient medications have been marked as taking for the 07/26/19 encounter (Office Visit) with Debbe OdeaAgbor-Etang, Nazaire Cordial, MD.     Allergies:   Patient has no known allergies.   Social History   Socioeconomic History  . Marital status: Single    Spouse name: Not on file  . Number of children: Not on file  . Years of education: Not on file  . Highest education level: Not on file  Occupational History  . Not on file  Social Needs  . Financial resource strain: Not on file  . Food insecurity    Worry: Not on file    Inability: Not on file  . Transportation needs    Medical: Not on file    Non-medical: Not on file  Tobacco Use  . Smoking status: Never Smoker  . Smokeless tobacco: Never Used  Substance and Sexual Activity  . Alcohol use: Never    Frequency: Never  . Drug use: Not on file  . Sexual activity: Not on file   Lifestyle  . Physical activity    Days per week: Not on file    Minutes per session: Not on file  . Stress: Not on file  Relationships  . Social Musicianconnections    Talks on phone: Not on file    Gets together: Not on file    Attends religious service: Not on file    Active member of club or organization: Not on file    Attends meetings of clubs or organizations: Not on file    Relationship status: Not on file  Other Topics Concern  . Not on file  Social History Narrative  . Not on file     Family History: The patient's family history includes Hypertension in his maternal grandmother and mother.  ROS:   Please see the history of present illness.     All other systems reviewed and are negative.  EKGs/Labs/Other Studies Reviewed:    The following studies were reviewed today:  Cardiac mri 07/06/2019 IMPRESSION: 1. The left ventricle is mildly dilated with EF 52%. 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 Significant LV hypertrophy is not present.  2.  Normal RV size and systolic function, EF 46%.  3. The aortic valve is bicuspid but there is no significant regurgitation or stenosis (the thoracic aorta is incompletely imaged  but does not appear dilated).  4. No myocardial LGE, so no definitive evidence for prior MI, infiltrative disease, or myocarditis.  I do not see evidence for hypertrophic cardiomyopathy or COVID-19 myocarditis.  Echocardiogram dated 06/19/2019 IMPRESSIONS   1. Left ventricular ejection fraction, by visual estimation, is 50 to 55%. The left ventricle has normal function. Mildly increased left ventricular size. There is moderately increased left ventricular hypertrophy.  2. Global right ventricle has normal systolic function.The right ventricular size is normal. No increase in right ventricular wall thickness.  3. Left atrial size was mildly dilated.  4. Right atrial size was mildly dilated.  5. The mitral  valve is normal in structure. No evidence of mitral valve regurgitation.  6. The tricuspid valve is grossly normal. Tricuspid valve regurgitation is trivial.  7. The aortic valve was not well visualized Aortic valve regurgitation is mild by color flow Doppler. There is no aortic valve stenosis.  8. The pulmonic valve was not well visualized. Pulmonic valve regurgitation is trivial by color flow Doppler.  9. Normal pulmonary artery systolic pressure. 10. The inferior vena cava is normal in size with greater than 50% respiratory variability, suggesting right atrial pressure of 3 mmHg. 11. The interatrial septum was not well visualized.  EKG:  EKG is  ordered today.  The ekg ordered today demonstrates normal sinus rhythm, LVH by voltage criteria.  Recent Labs: 09/26/2018: Hemoglobin 14.2; Platelets 206 06/21/2019: BUN 18; Creatinine, Ser 1.20; Potassium 4.4; Sodium 139  Recent Lipid Panel No results found for: CHOL, TRIG, HDL, CHOLHDL, VLDL, LDLCALC, LDLDIRECT  Physical Exam:    VS:  BP 120/88 (BP Location: Left Arm, Patient Position: Sitting, Cuff Size: Large)   Pulse 85   Ht 6\' 2"  (1.88 m)   Wt (!) 312 lb (141.5 kg)   SpO2 98%   BMI 40.06 kg/m     Wt Readings from Last 3 Encounters:  07/26/19 (!) 312 lb (141.5 kg) (>99 %, Z= 3.19)*  06/21/19 (!) 307 lb (139.3 kg) (>99 %, Z= 3.14)*   * Growth percentiles are based on CDC (Boys, 2-20 Years) data.     GEN:  Well nourished, well developed in no acute distress HEENT: Normal NECK: No JVD; No carotid bruits LYMPHATICS: No lymphadenopathy CARDIAC: RRR, no murmurs, rubs, gallops RESPIRATORY:  Clear to auscultation without rales, wheezing or rhonchi  ABDOMEN: Soft, non-tender, non-distended MUSCULOSKELETAL:  No edema; No deformity  SKIN: Warm and dry NEUROLOGIC:  Alert and oriented x 3 PSYCHIATRIC:  Normal affect   ASSESSMENT:   The patient is currently asymptomatic.  His EKG did reveal LVH.  His echocardiogram did reveal mild right  atrial enlargement, mild left atrial enlargement, left ventricular enlargement.  Echocardiogram also revealed moderate LVH  MRI did reveal mild LVH of 12 mm, EF was normal, mild LV dilatation.    These findings are consistent with athletic heart.  MRI has better tissue contrast resolution and is a better test for measurement of LV thickness compared with echocardiography.  1. LVH (left ventricular hypertrophy)    PLAN:    In order of problems listed above:  1. Patient can therefore resume athletic activity under the guidance of healthcare team with close monitoring.  Follow-up as needed  Medication Adjustments/Labs and Tests Ordered: Current medicines are reviewed at length with the patient today.  Concerns regarding medicines are outlined above.  No orders of the defined types were placed in this encounter.  No orders of the defined types were placed in this  encounter.   Patient Instructions  Medication Instructions:  - Your physician recommends that you continue on your current medications as directed. Please refer to the Current Medication list given to you today.  *If you need a refill on your cardiac medications before your next appointment, please call your pharmacy*  Lab Work: - none ordered  If you have labs (blood work) drawn today and your tests are completely normal, you will receive your results only by: Marland Kitchen MyChart Message (if you have MyChart) OR . A paper copy in the mail If you have any lab test that is abnormal or we need to change your treatment, we will call you to review the results.  Testing/Procedures: - none ordered  Follow-Up: At [facility], you and your health needs are our priority.  As part of our continuing mission to provide you with exceptional heart care, we have created designated Provider Care Teams.  These Care Teams include your primary Cardiologist (physician) and Advanced Practice Providers (APPs -  Physician Assistants and Nurse  Practitioners) who all work together to provide you with the care you need, when you need it.  Your next appointment:   as needed  The format for your next appointment:   n/a  Provider:   n/a  Other Instructions n/a     Signed, Debbe Odea, MD  07/26/2019 11:41 AM    [facility]

## 2019-07-26 NOTE — Patient Instructions (Signed)
Medication Instructions:  - Your physician recommends that you continue on your current medications as directed. Please refer to the Current Medication list given to you today.  *If you need a refill on your cardiac medications before your next appointment, please call your pharmacy*  Lab Work: - none ordered  If you have labs (blood work) drawn today and your tests are completely normal, you will receive your results only by: . MyChart Message (if you have MyChart) OR . A paper copy in the mail If you have any lab test that is abnormal or we need to change your treatment, we will call you to review the results.  Testing/Procedures: - none ordered  Follow-Up: At [facility], you and your health needs are our priority.  As part of our continuing mission to provide you with exceptional heart care, we have created designated Provider Care Teams.  These Care Teams include your primary Cardiologist (physician) and Advanced Practice Providers (APPs -  Physician Assistants and Nurse Practitioners) who all work together to provide you with the care you need, when you need it.  Your next appointment:   as needed  The format for your next appointment:   n/a  Provider:   n/a  Other Instructions n/a  

## 2019-07-31 ENCOUNTER — Ambulatory Visit: Payer: Medicaid Other | Admitting: Cardiology

## 2019-10-17 ENCOUNTER — Other Ambulatory Visit: Payer: Self-pay | Admitting: Family Medicine

## 2019-10-17 DIAGNOSIS — M7989 Other specified soft tissue disorders: Secondary | ICD-10-CM

## 2019-10-19 ENCOUNTER — Ambulatory Visit
Admission: RE | Admit: 2019-10-19 | Discharge: 2019-10-19 | Disposition: A | Discharge status: Home / Self Care | Payer: PRIVATE HEALTH INSURANCE | Source: Ambulatory Visit | Attending: Family Medicine | Admitting: Family Medicine

## 2019-10-19 ENCOUNTER — Other Ambulatory Visit: Payer: Self-pay

## 2019-10-19 DIAGNOSIS — M7989 Other specified soft tissue disorders: Secondary | ICD-10-CM

## 2019-10-19 IMAGING — CR DG HAND COMPLETE 3+V*R*
1 series · 3 of 3 positions shown · non-contrast
Comparison: None.

CLINICAL DATA: Pt states that injured during football practice 1
week ago and feels pain in the 3rd mcp area.

EXAM:
RIGHT HAND - COMPLETE 3+ VIEW

[Series 1: dg hand complete right · 0.14mm/px · 3 of 3 slices shown]
[im 1/3]
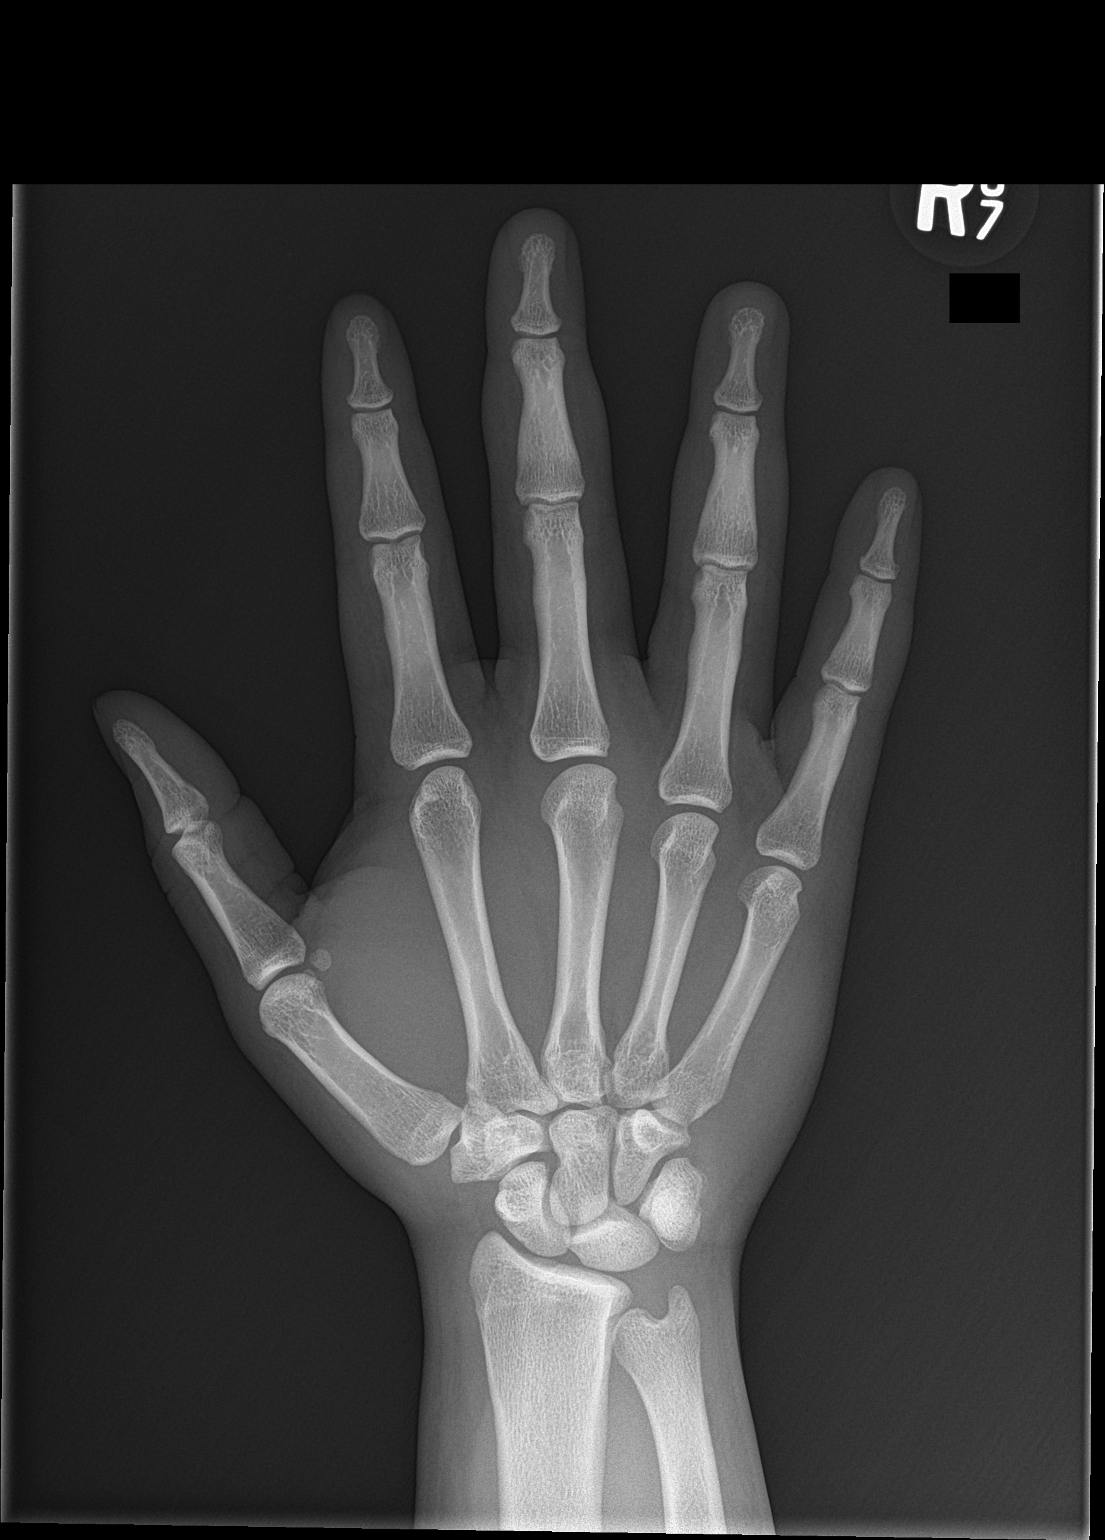
[im 2/3]
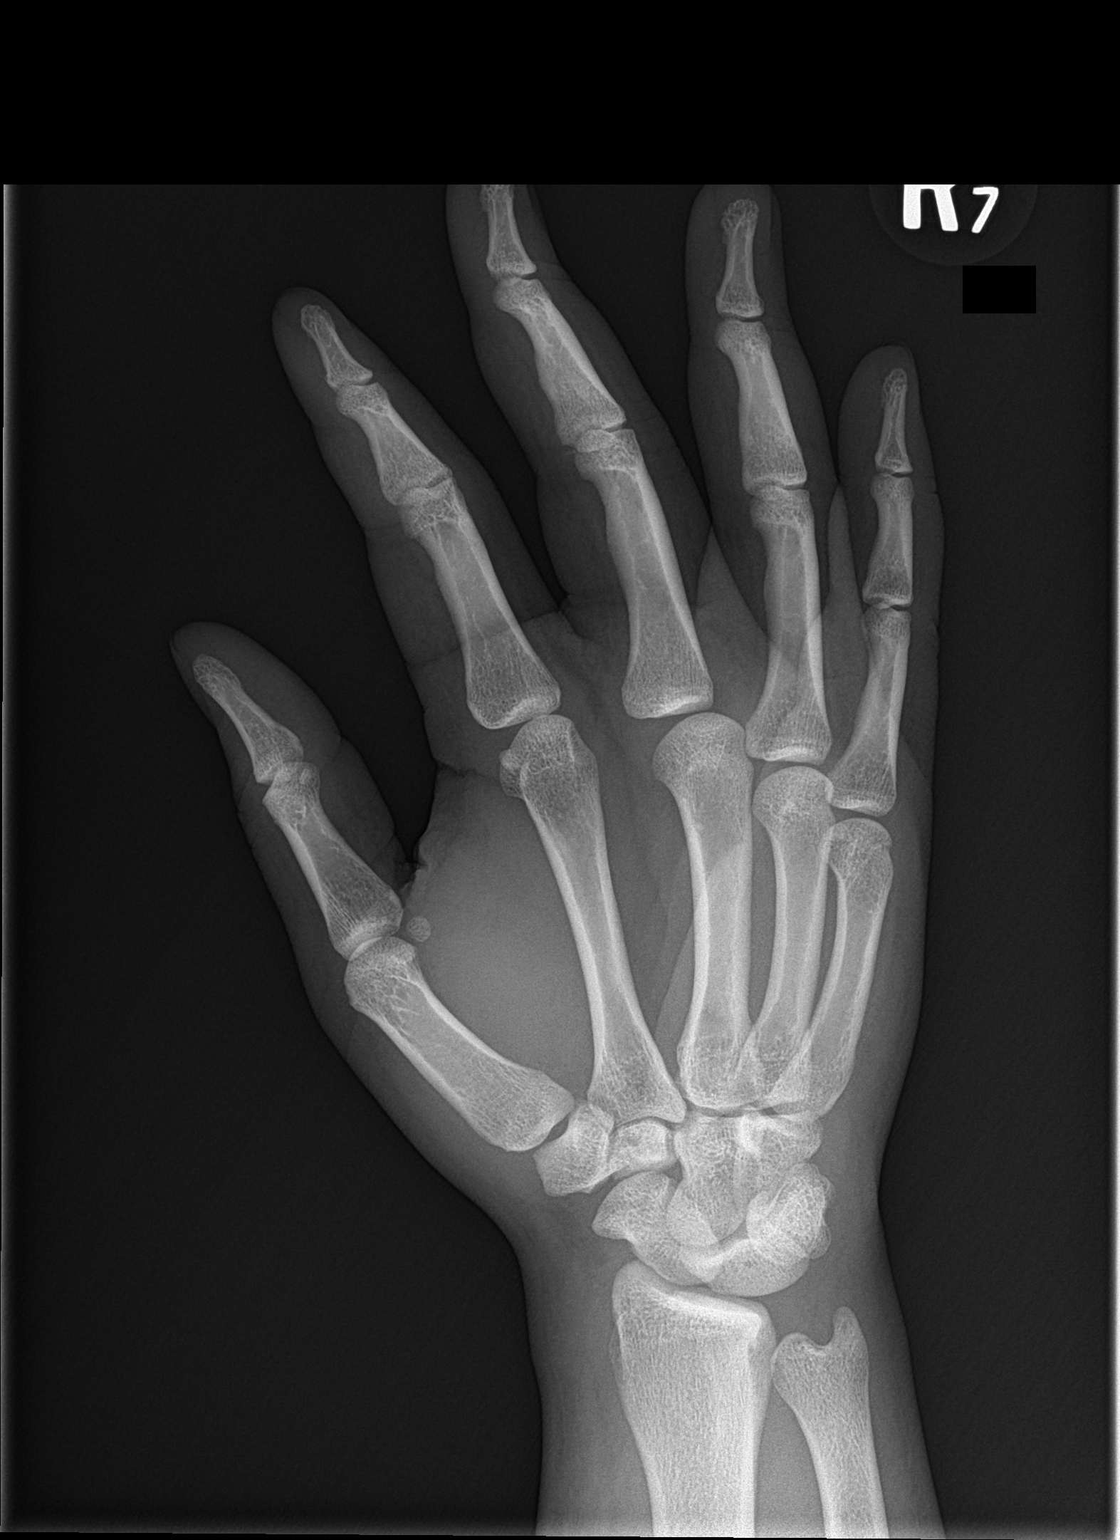
[im 3/3]
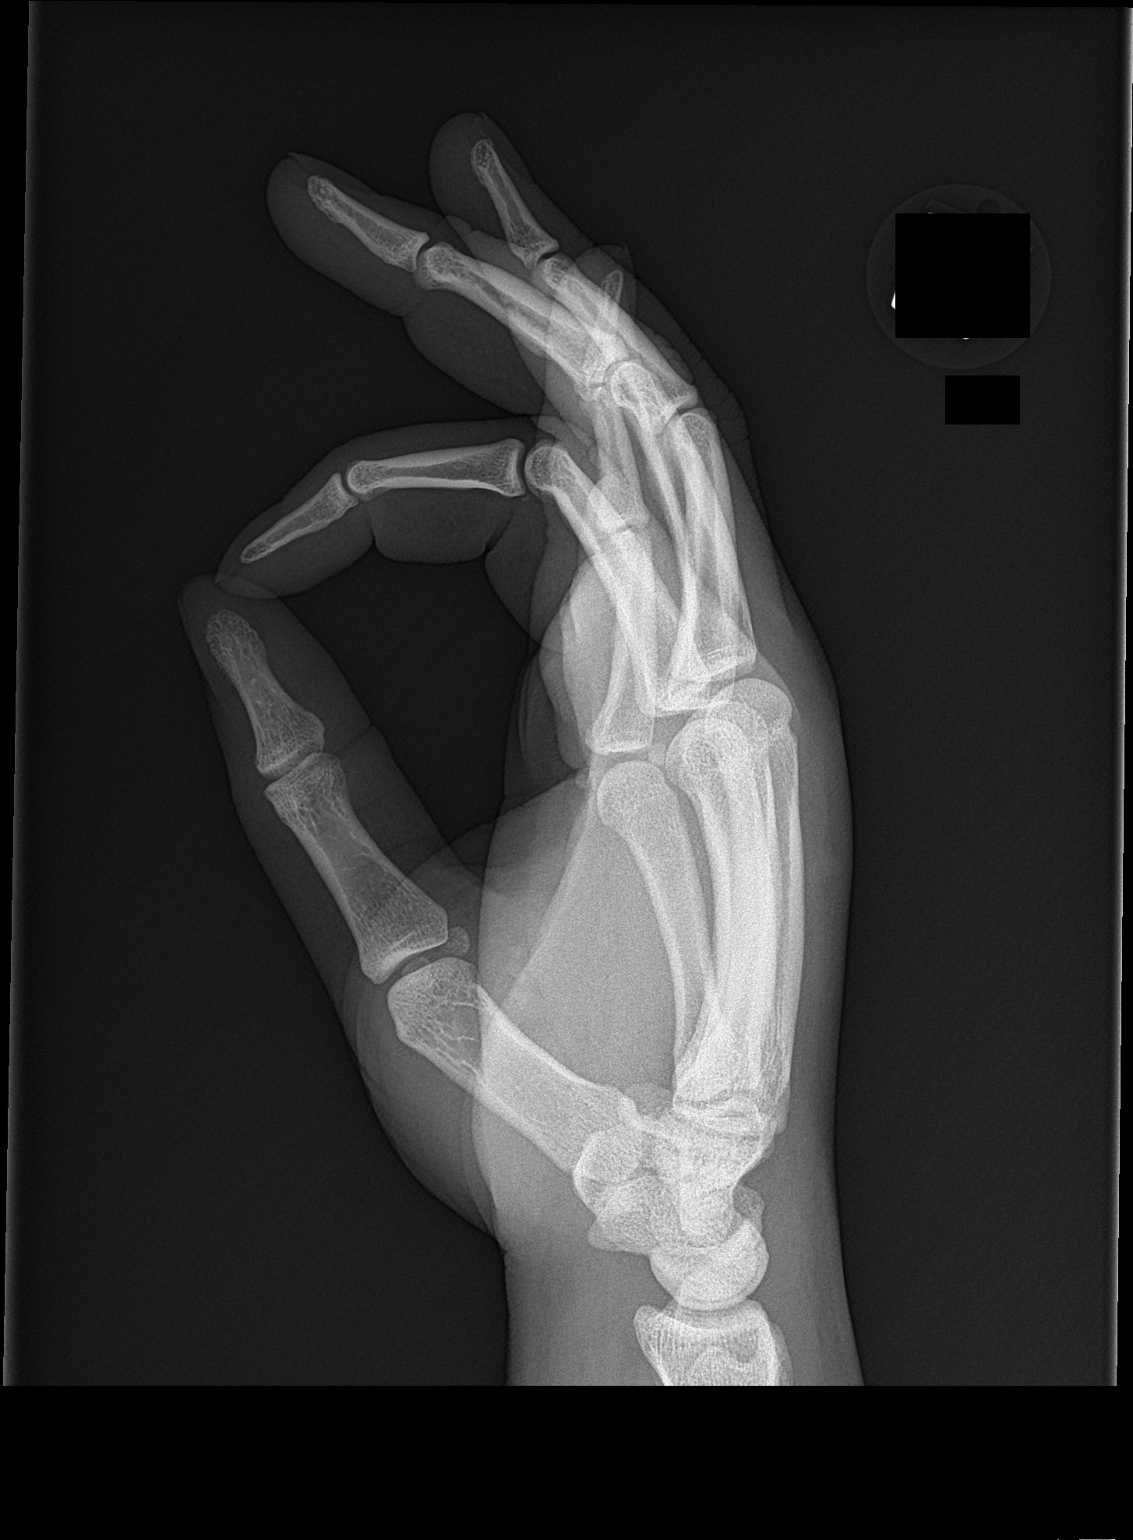

[3 of 3 positions shown; findings below may reference images not displayed]

FINDINGS: There is no evidence of fracture or dislocation. There is no
evidence of arthropathy or other focal bone abnormality. Soft
tissues are unremarkable.
IMPRESSION: Negative.

## 2019-11-07 ENCOUNTER — Ambulatory Visit
Admission: RE | Admit: 2019-11-07 | Discharge: 2019-11-07 | Disposition: A | Discharge status: Home / Self Care | Payer: PRIVATE HEALTH INSURANCE | Source: Ambulatory Visit | Attending: Family Medicine | Admitting: Family Medicine

## 2019-11-07 ENCOUNTER — Other Ambulatory Visit: Payer: Self-pay | Admitting: Family Medicine

## 2019-11-07 ENCOUNTER — Other Ambulatory Visit: Payer: Self-pay

## 2019-11-07 DIAGNOSIS — S29011A Strain of muscle and tendon of front wall of thorax, initial encounter: Secondary | ICD-10-CM | POA: Diagnosis not present

## 2019-11-07 IMAGING — CR DG HUMERUS 2V *R*
1 series · 2 of 2 positions shown · non-contrast
Comparison: None.

CLINICAL DATA: Hyperextension injury.

EXAM:
RIGHT HUMERUS - 2+ VIEW

[Series 1: dg humerus right · 0.14mm/px · 2 of 2 slices shown]
[im 1/2]
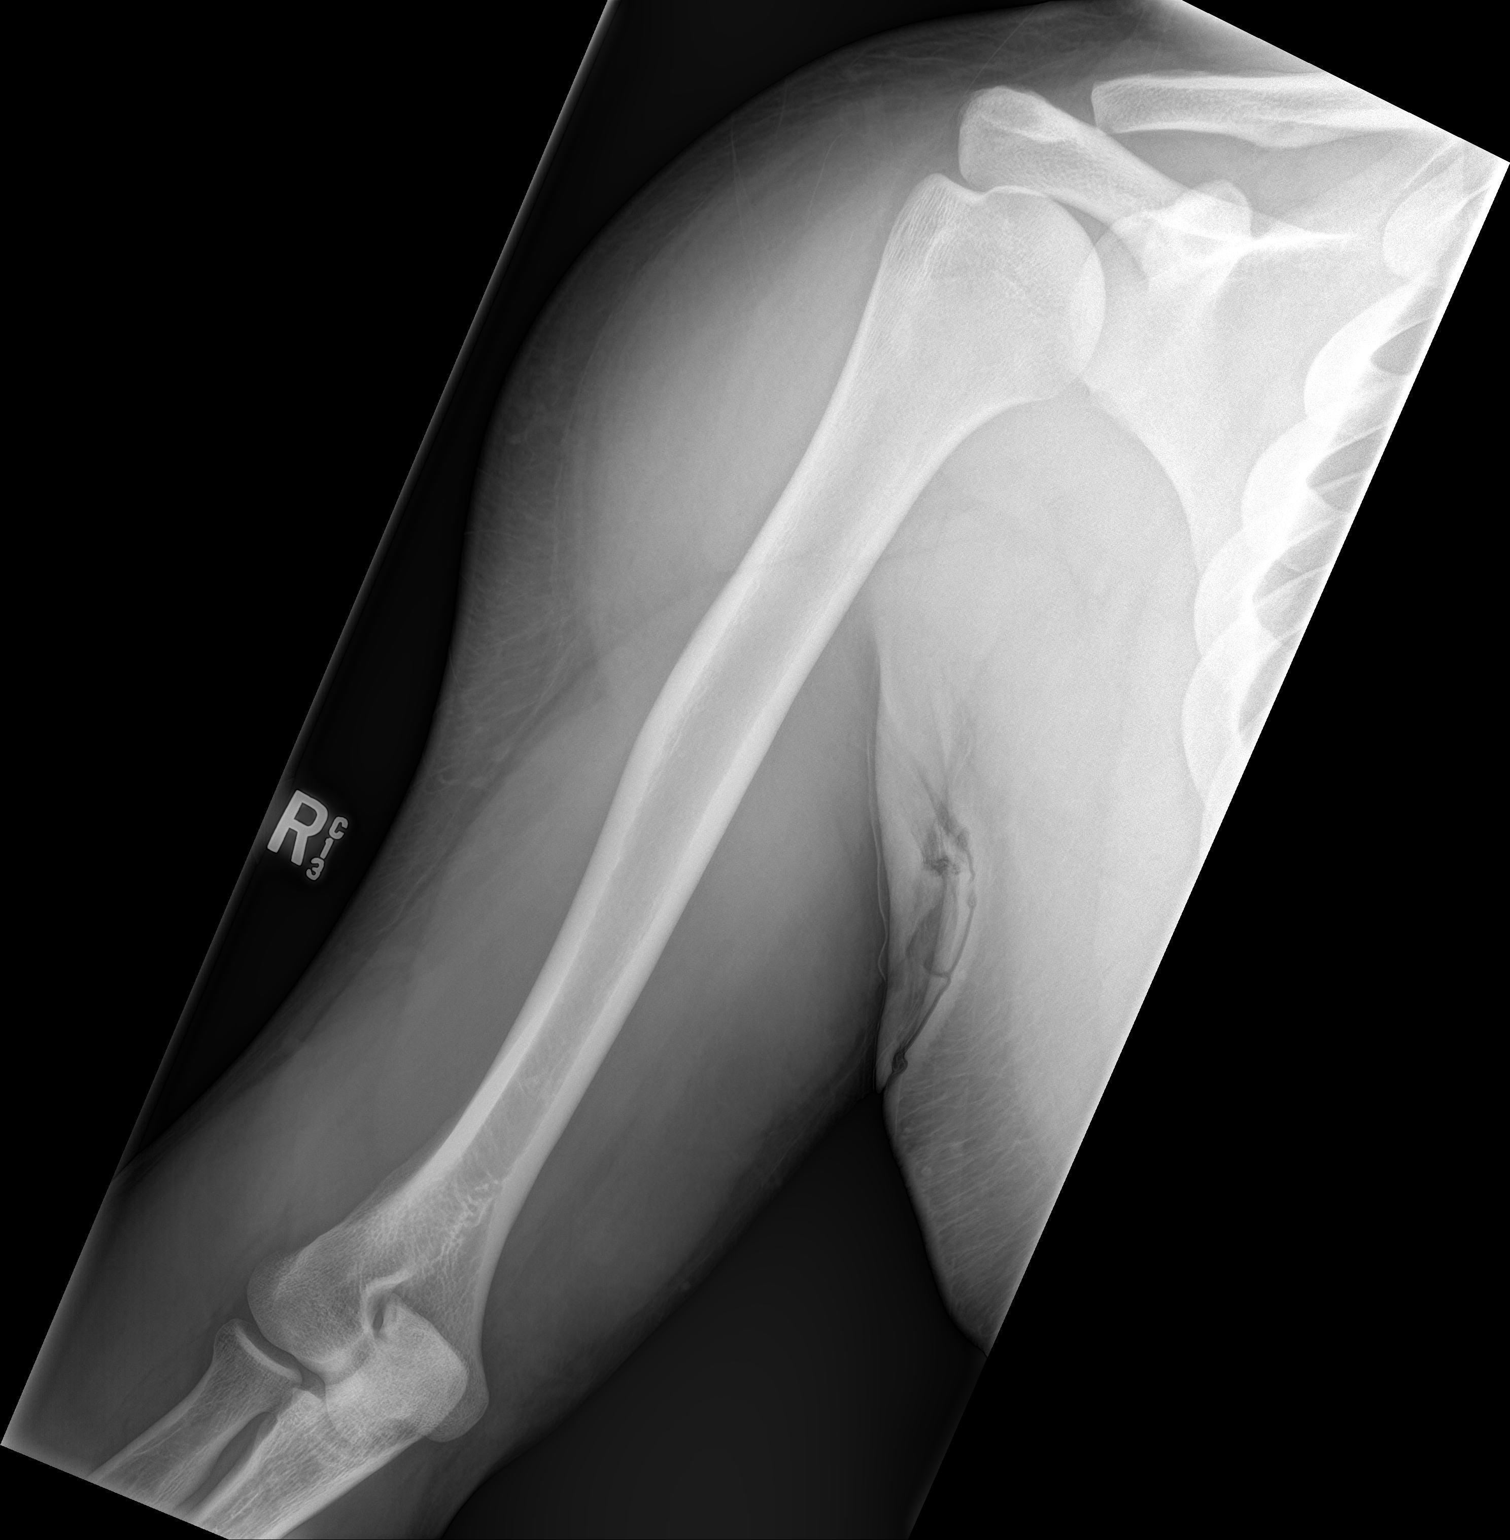
[im 2/2]
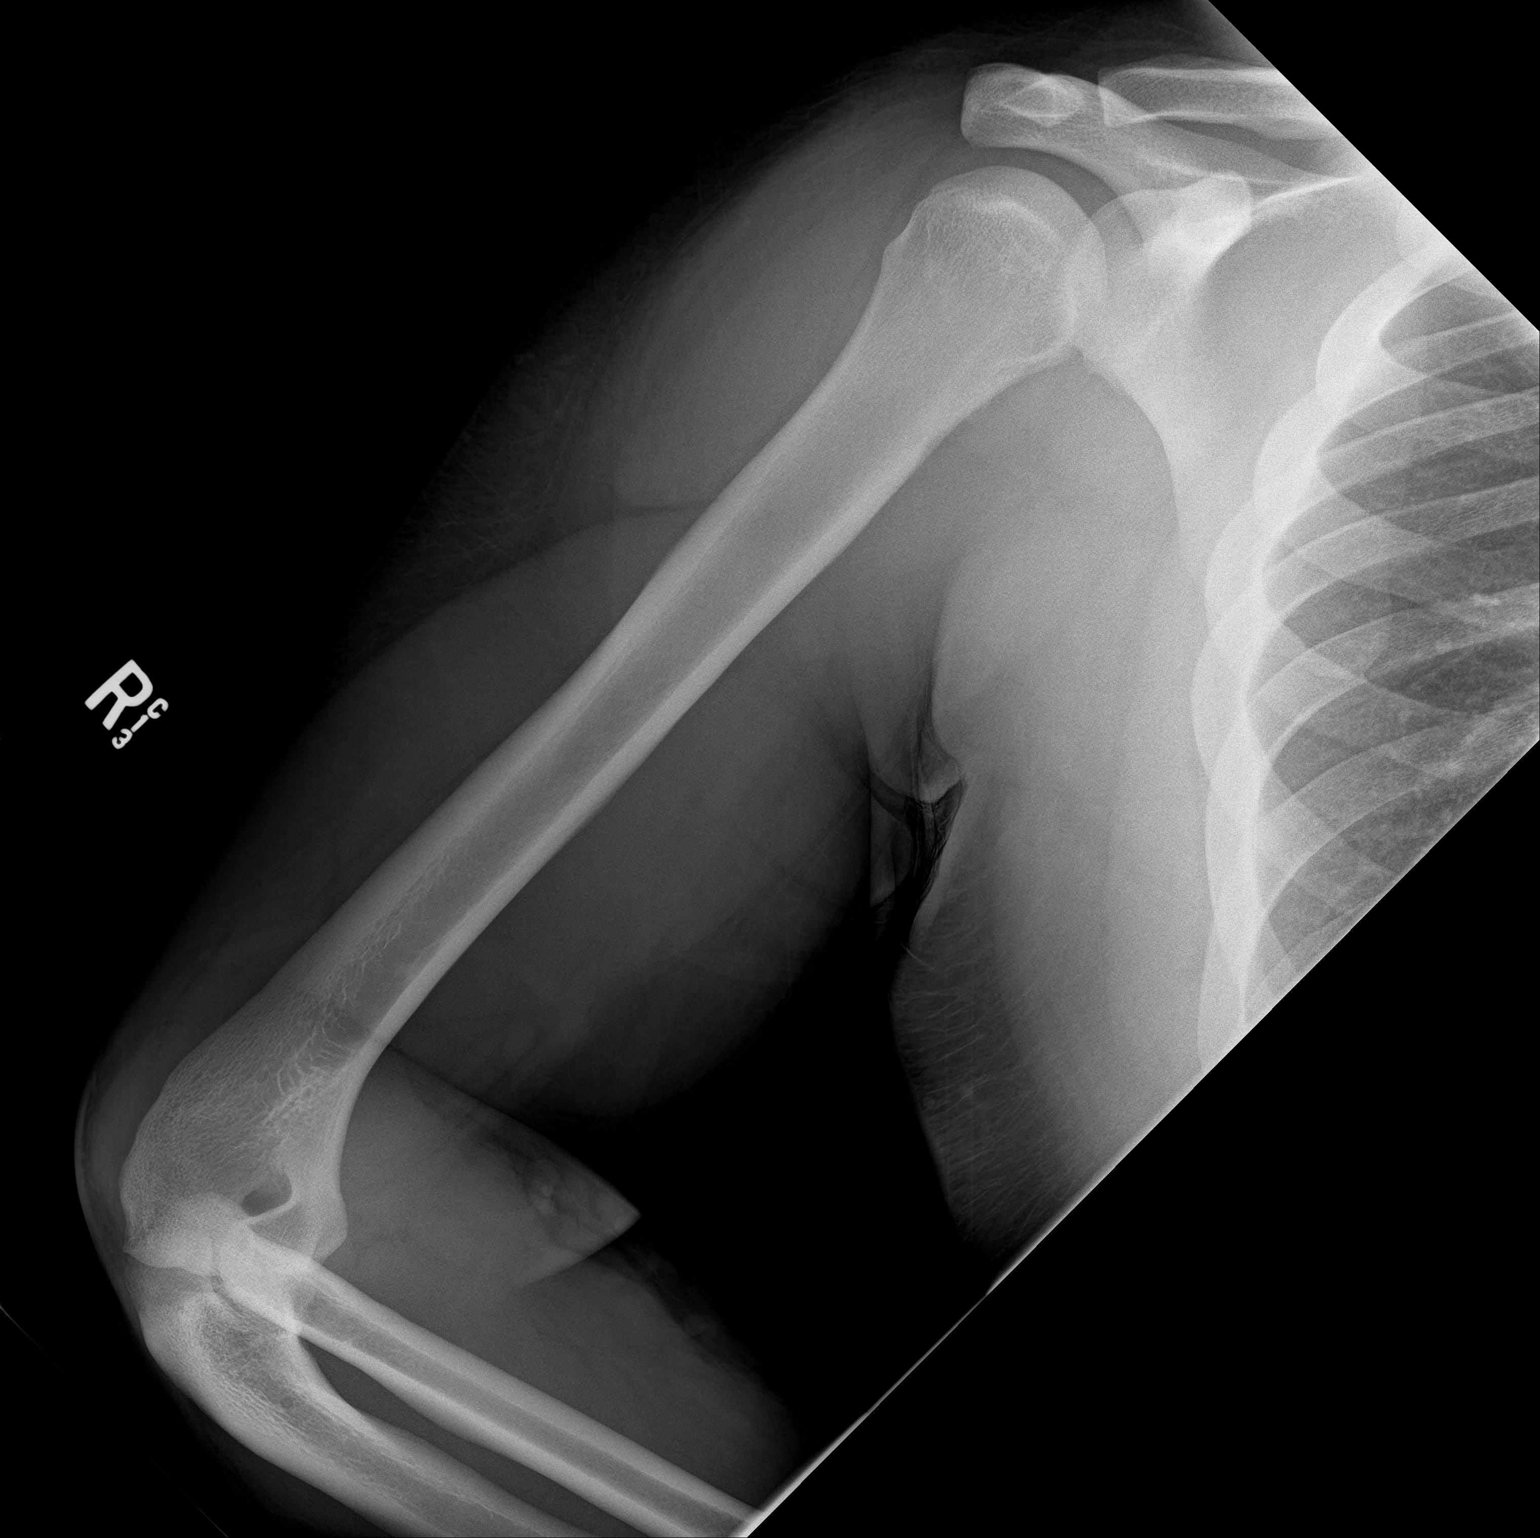

[2 of 2 positions shown; findings below may reference images not displayed]

FINDINGS: Frontal and lateral views obtained. No fracture or dislocation. No
joint space narrowing or erosion. There is soft tissue prominence
along the lateral proximal upper arm with questionable hematoma.
This area of soft tissue fullness measures 16.4 x 6.3 cm.
IMPRESSION: Suspect large hematoma in the lateral upper arm region. No fracture
or dislocation. No appreciable arthropathy.

These results will be called to the ordering clinician or
representative by the Radiologist Assistant, and communication
documented in the PACS or zVision Dashboard.

## 2019-11-07 NOTE — Progress Notes (Signed)
right

## 2019-11-08 ENCOUNTER — Telehealth (INDEPENDENT_AMBULATORY_CARE_PROVIDER_SITE_OTHER): Payer: Medicaid Other | Admitting: Family Medicine

## 2019-11-08 DIAGNOSIS — S29011A Strain of muscle and tendon of front wall of thorax, initial encounter: Secondary | ICD-10-CM | POA: Diagnosis not present

## 2019-11-08 DIAGNOSIS — S46909A Unspecified injury of unspecified muscle, fascia and tendon at shoulder and upper arm level, unspecified arm, initial encounter: Secondary | ICD-10-CM | POA: Diagnosis not present

## 2019-11-08 NOTE — Progress Notes (Signed)
Virtual Visit Agreed To  Patient admits to having bruising and pain along the right medial upper arm area (close to the axillae). Has no significant pain in the muscle belly of the pec. muscle. 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 Admits to pain and decreased strength with the activity. Denies any CP, SOB, fever/chills, swelling of the RUE. Taking NSAIDs occasionally.    No vitals available. ROS: Negative except mentioned above. GENERAL: NAD MSK: Difficult to assess in detail virtually, points to area of pain along upper medial aspect of the humerus extending some to the anterior axillae area, decreased R shoulder forward flexion  A/P: Pectoral Injury- xray of humerus shows no bony abnormality but possible hematoma, needs further imaging with MRI Chest to evaluate further. Suspect pec injury involving tendon attachment. If not a pec tendon injury would need to look further at the right shoulder. Rest, ice, NSAIDs prn, seek medical attention if symptoms change or worsen, will need to follow with Dr. Ardine Eng as well once image is complete. Discussed plan above with patient and trainer.

## 2019-11-09 ENCOUNTER — Other Ambulatory Visit: Payer: Self-pay

## 2019-11-09 ENCOUNTER — Ambulatory Visit
Admission: RE | Admit: 2019-11-09 | Discharge: 2019-11-09 | Disposition: A | Discharge status: Home / Self Care | Payer: PRIVATE HEALTH INSURANCE | Source: Ambulatory Visit | Attending: Family Medicine | Admitting: Family Medicine

## 2019-11-09 DIAGNOSIS — S29011A Strain of muscle and tendon of front wall of thorax, initial encounter: Secondary | ICD-10-CM | POA: Diagnosis not present

## 2019-11-09 IMAGING — MR MR CHEST MEDIASTINUM W/O CM
12 series · 16 of 16 positions shown · non-contrast
Comparison: None.

CLINICAL DATA: Chest trauma, patient is a football player with
right pectoral injury suspected. Injured last [REDACTED].

EXAM:
MRI RIGHT ANTERIOR CHEST WITHOUT CONTRAST
TECHNIQUE: Multiplanar, multiecho pulse sequences of the right anterior chest
was obtained without intravenous contrast.

[Series 2: T1 · axial · 5.0mm · 0.75mm/px · z∈[-111,+123]mm · 2 of 40 slices shown (1 of 4)]
[im 1/40]
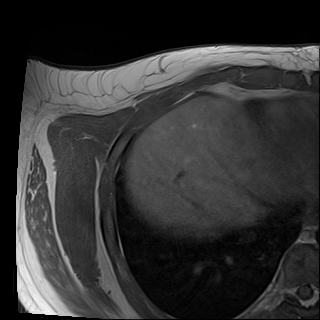
[im 40/40]
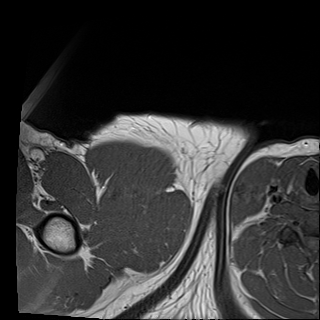

[Series 3: T2 fat-sat · axial · 5.0mm · 0.68mm/px · z∈[-111,+123]mm · 2 of 40 slices shown (1 of 3)]
[im 1/40]
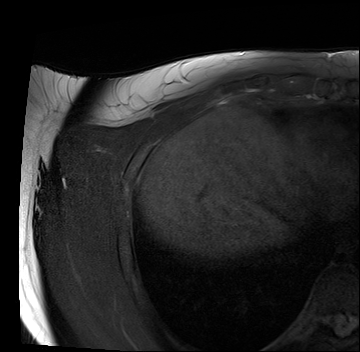
[im 40/40]
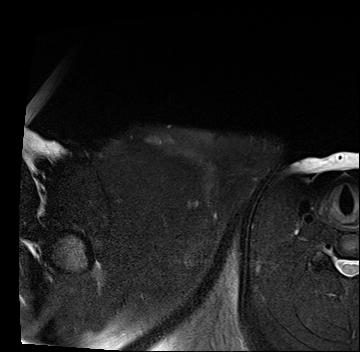

[Series 5: T1 · coronal · 3.0mm · 0.75mm/px · 2 of 30 slices shown (2 of 4)]
[im 1/30]
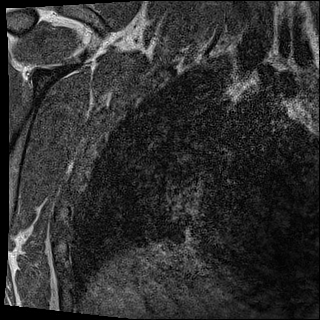
[im 30/30]
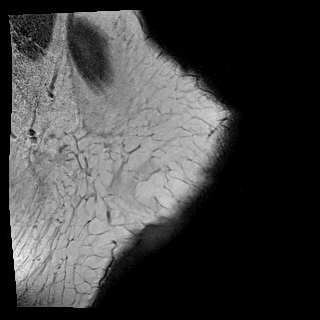

[Series 6: PD fat-sat · sagittal · 4.0mm · 0.81mm/px · 1 of 42 slices shown (1 of 2)]
[im 1/42]
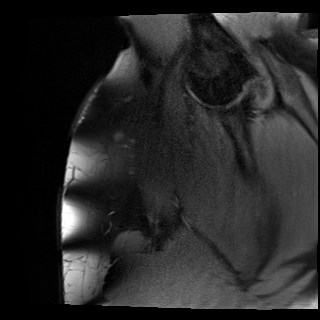

[Series 7: STIR · sagittal · 4.0mm · 1.09mm/px · 1 of 42 slices shown (1 of 3)]
[im 1/42]
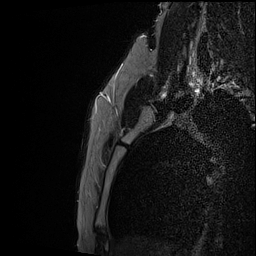

[Series 8: STIR · coronal · 3.0mm · 1.17mm/px · 1 of 30 slices shown (2 of 3)]
[im 1/30]
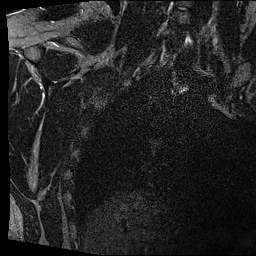

[Series 9: T2 fat-sat · axial · 5.0mm · 0.68mm/px · 1 of 17 slices shown (2 of 3)]
[im 1/17]
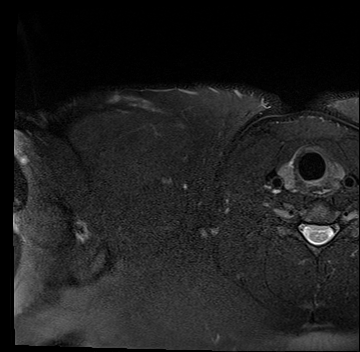

[Series 12: T1 · axial · 5.0mm · 0.94mm/px · 1 of 40 slices shown (3 of 4)]
[im 1/40]
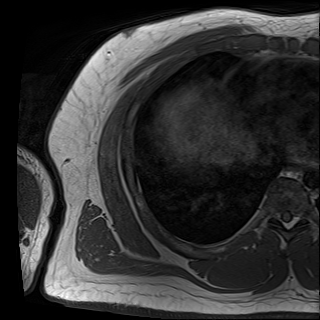

[Series 13: T2 fat-sat · axial · 5.0mm · 0.94mm/px · 1 of 40 slices shown (3 of 3)]
[im 1/40]
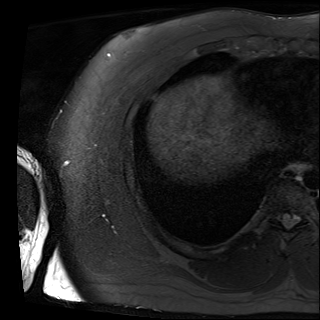

[Series 14: STIR · coronal · 3.0mm · 1.17mm/px · 1 of 37 slices shown (3 of 3)]
[im 1/37]
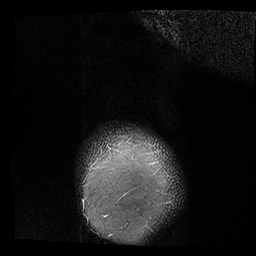

[Series 15: T1 · coronal · 3.0mm · 1.17mm/px · 1 of 37 slices shown (4 of 4)]
[im 1/37]
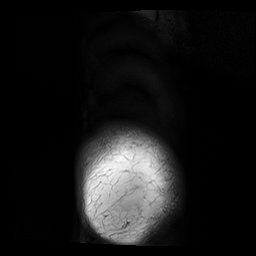

[Series 16: PD fat-sat · sagittal · 4.0mm · 0.81mm/px · 2 of 59 slices shown (2 of 2)]
[im 1/59]
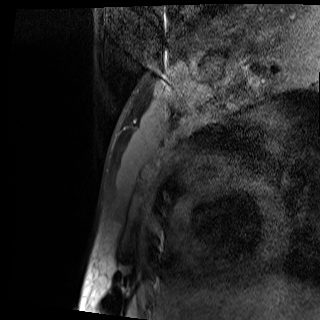
[im 59/59]
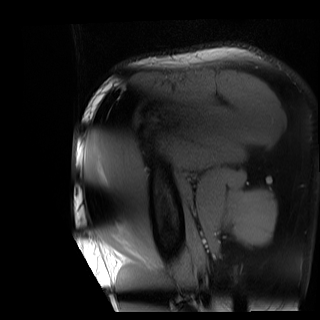

[16 of 16 positions shown; findings below may reference images not displayed]

FINDINGS: Bones/Joint/Cartilage

No marrow signal abnormality. No fracture or dislocation. Normal
alignment. No joint effusion. No aggressive osseous lesion.

Ligaments, Muscles and Tendons
Severe muscle edema and fluid in the inferior aspect of the right
anterior deltoid muscle most concerning for a tear.

Remainder the muscles demonstrate no focal abnormality. No
intramuscular fluid collection or hematoma.

Soft tissue
No fluid collection or hematoma.  No soft tissue mass.
IMPRESSION: 1. Partial-thickness tear of the inferior aspect of the right
anterior deltoid muscle.
2. Right pectoralis muscle and tendon are intact without focal
abnormality to suggest an injury.

## 2019-12-10 ENCOUNTER — Ambulatory Visit: Payer: PRIVATE HEALTH INSURANCE | Attending: Internal Medicine

## 2019-12-10 DIAGNOSIS — Z23 Encounter for immunization: Secondary | ICD-10-CM

## 2019-12-10 NOTE — Progress Notes (Signed)
   Cov[facility] Vaccination [facility]  Name:  Gary Johnson    MRN: [id number] DOB: 09-18-2000  12/10/2019  Gary Johnson was observed post Covid-19 immunization for 15 minutes without incident. He was provided with Vaccine Information Sheet and instruction to access the V-Safe system.   Gary Johnson was instructed to call 911 with any severe reactions post vaccine: Marland Kitchen Difficulty breathing  . Swelling of face and throat  . A fast heartbeat  . A bad rash all over body  . Dizziness and weakness   Immunizations Administered    Name Date Dose VIS Date Route   Pfizer COVID-19 Vaccine 12/10/2019  4:47 PM 0.3 mL 08/24/2019 Intramuscular   Manufacturer: [employer], [employer]   Lot: [id number]   NDC: [address]-8

## 2019-12-31 ENCOUNTER — Ambulatory Visit: Payer: PRIVATE HEALTH INSURANCE | Attending: Internal Medicine

## 2019-12-31 DIAGNOSIS — Z23 Encounter for immunization: Secondary | ICD-10-CM

## 2019-12-31 NOTE — Progress Notes (Signed)
   Covid-19 Vaccination Clinic  Name:  Gary Johnson    MRN: [id number] DOB: 05-Jul-2001  12/31/2019  Gary Johnson was observed post Covid-19 immunization for 15 minutes without incident. He was provided with Vaccine Information Sheet and instruction to access the V-Safe system.   Gary Johnson was instructed to call 911 with any severe reactions post vaccine: Marland Kitchen Difficulty breathing  . Swelling of face and throat  . A fast heartbeat  . A bad rash all over body  . Dizziness and weakness   Immunizations Administered    Name Date Dose VIS Date Route   Pfizer COVID-19 Vaccine 12/31/2019  4:35 PM 0.3 mL 11/07/2018 Intramuscular   Manufacturer: [employer], [employer]   Lot: [id number]   NDC: [address]-5
